# Patient Record
Sex: Female | Born: 1980 | Race: Black or African American | Hispanic: No | Marital: Single | Smoking: Never smoker
Health system: Southern US, Community
[De-identification: ages and names within clinical notes are randomized; demographics above are authoritative.]

## PROBLEM LIST (undated history)

## (undated) ENCOUNTER — Inpatient Hospital Stay (HOSPITAL_COMMUNITY): Payer: Self-pay

## (undated) DIAGNOSIS — R51 Headache: Secondary | ICD-10-CM

## (undated) DIAGNOSIS — D649 Anemia, unspecified: Secondary | ICD-10-CM

## (undated) DIAGNOSIS — R519 Headache, unspecified: Secondary | ICD-10-CM

## (undated) HISTORY — PX: WISDOM TOOTH EXTRACTION: SHX21

## (undated) HISTORY — PX: DILATION AND CURETTAGE OF UTERUS: SHX78

## (undated) HISTORY — PX: HERNIA REPAIR: SHX51

---

## 2014-09-10 LAB — OB RESULTS CONSOLE GC/CHLAMYDIA
CHLAMYDIA, DNA PROBE: NEGATIVE
GC PROBE AMP, GENITAL: NEGATIVE

## 2014-09-10 LAB — OB RESULTS CONSOLE ABO/RH: RH Type: POSITIVE

## 2014-09-10 LAB — OB RESULTS CONSOLE ANTIBODY SCREEN: Antibody Screen: NEGATIVE

## 2014-09-10 LAB — OB RESULTS CONSOLE RPR: RPR: NONREACTIVE

## 2014-09-10 LAB — OB RESULTS CONSOLE HEPATITIS B SURFACE ANTIGEN: Hepatitis B Surface Ag: NEGATIVE

## 2014-09-10 LAB — OB RESULTS CONSOLE HIV ANTIBODY (ROUTINE TESTING): HIV: NONREACTIVE

## 2014-10-07 ENCOUNTER — Other Ambulatory Visit (HOSPITAL_COMMUNITY): Payer: Self-pay | Admitting: Obstetrics and Gynecology

## 2014-10-07 DIAGNOSIS — O4402 Placenta previa specified as without hemorrhage, second trimester: Secondary | ICD-10-CM

## 2014-10-07 DIAGNOSIS — Z3689 Encounter for other specified antenatal screening: Secondary | ICD-10-CM

## 2014-10-13 ENCOUNTER — Ambulatory Visit (HOSPITAL_COMMUNITY)
Admission: RE | Admit: 2014-10-13 | Discharge: 2014-10-13 | Disposition: A | Discharge status: Home / Self Care | Payer: Medicaid Other | Source: Ambulatory Visit | Attending: Obstetrics and Gynecology | Admitting: Obstetrics and Gynecology

## 2014-10-13 ENCOUNTER — Encounter (HOSPITAL_COMMUNITY): Payer: Self-pay

## 2014-10-13 ENCOUNTER — Other Ambulatory Visit (HOSPITAL_COMMUNITY): Payer: Self-pay | Admitting: Obstetrics and Gynecology

## 2014-10-13 DIAGNOSIS — O4402 Placenta previa specified as without hemorrhage, second trimester: Secondary | ICD-10-CM

## 2014-10-13 DIAGNOSIS — O43192 Other malformation of placenta, second trimester: Secondary | ICD-10-CM | POA: Diagnosis not present

## 2014-10-13 DIAGNOSIS — O43212 Placenta accreta, second trimester: Secondary | ICD-10-CM

## 2014-10-13 DIAGNOSIS — Z609 Problem related to social environment, unspecified: Secondary | ICD-10-CM | POA: Insufficient documentation

## 2014-10-13 DIAGNOSIS — N856 Intrauterine synechiae: Secondary | ICD-10-CM

## 2014-10-13 DIAGNOSIS — O4440 Low lying placenta NOS or without hemorrhage, unspecified trimester: Secondary | ICD-10-CM | POA: Insufficient documentation

## 2014-10-13 DIAGNOSIS — Z3A23 23 weeks gestation of pregnancy: Secondary | ICD-10-CM | POA: Insufficient documentation

## 2014-10-13 DIAGNOSIS — Z3689 Encounter for other specified antenatal screening: Secondary | ICD-10-CM

## 2014-10-13 DIAGNOSIS — Z659 Problem related to unspecified psychosocial circumstances: Secondary | ICD-10-CM | POA: Insufficient documentation

## 2014-10-13 HISTORY — DX: Anemia, unspecified: D64.9

## 2014-10-13 HISTORY — DX: Headache, unspecified: R51.9

## 2014-10-13 HISTORY — DX: Headache: R51

## 2014-10-13 MED ORDER — BETAMETHASONE SOD PHOS & ACET 6 (3-3) MG/ML IJ SUSP: 12.0000 mg | Freq: Every day | INTRAMUSCULAR | Status: DC

## 2014-10-13 NOTE — Progress Notes (Signed)
MATERNAL FETAL MEDICINE CONSULT  Patient Name: Carla Rodgers Medical Record Number:  [id number] Date of Birth: 03/13/1981 Requesting Physician Name:  Jaymes GraffNaima Dillard, MD Date of Service: 10/13/2014  Chief Complaint Abnormal placentation  History of Present Illness Carla Rodgers was seen today for prenatal diagnosis secondary to abnormal placentation at the request of Dr. Joya Gaskinsilliard.  The patient is a 33 y.o. [id number],LOG4P1021,at 6395w5d with an EDD of 02/04/2015, by Ultrasound dating method. Patient complains of recent upper respiratory infection but otherwise has no complaints. She denies vaginal bleeding, contractions, and loss of fluid. Please see assessment and plan for additional pertinent details regarding patient problems.  Patient History OB History  Gravida Para Term Preterm AB SAB TAB Ectopic Multiple Living  4 1 1  2 1 1   1     # Outcome Date GA Lbr Len/2nd Weight Sex Delivery Anes PTL Lv  4 Current           3 TAB           2 SAB           1 Term               Past Medical History  Diagnosis Date  . Anemia   . Headache     Past Surgical History  Procedure Laterality Date  . Hernia repair    . Cesarean section    . Dilation and curettage of uterus    . Wisdom tooth extraction      History   Social History  . Marital Status: Single    Spouse Name: N/A    Number of Children: N/A  . Years of Education: N/A   Social History Main Topics  . Smoking status: Never Smoker   . Smokeless tobacco: Not on file  . Alcohol Use: No  . Drug Use: No  . Sexual Activity: Not on file   Other Topics Concern  . Not on file   Social History Narrative  . No narrative on file   In addition, the patient has no family history of mental retardation, birth defects, or genetic diseases. She specifically denies any history of fragile X, phenylketonuria, sickle cell disease and other hemoglobinopathies, clotting disorders, and thrombophilias.  33 year old G4P1021 at 23+5 weeks' gestation  with low-lying placenta, possible accreta, succenturiate placenta, and uterine synechiae with bridging vessels  Assessment and Recommendations  1. Low-lying placenta with suspicion of placenta accreta, uterine synechae with bridging vessels - Right-sided synechiae in lower uterine segment with vessels coursing along its length - Probable succinturiate placenta - Placenta does not cover internal cervical os but lies within 1.2 cm - I spoke with Dr. Redmond Basemanillard's partner, Dr. Estanislado Pandyivard, regarding the patient's diagnosis and plan of care  Counseled: - No intercourse - Increased risk of pre-term delivery, hemorrhage, fetal growth restriction - Increased risk of cesarean hysterectomy, rendering patient infertile, especially if placenta adherent - Report to nearest hospital if labor, vaginal bleeding, or rupture of membranes  Plan - Outpatient course of betamethasone; [place] so will administer next week - Weekly cervical lengths; [facility] and thereafter alternate weeks with Dr. Redmond Basemanillard's practice - Inpatient management advised if cervix < 2.5 cm  - Fetal growth assessment every 3-4 weeks - Reassess placenta during growth studies - If suspicion for accreta persists, [facility]; also plan for consultation with gynecologic oncology and anesthesiology and obtain MRI  2.  Challenging social situation - [place] (per her report) - She does not own a car and must take public transportation to [place]  Counseled: - Notify bus driver immediately if bleeding, labor, or rupture of membranes, and have bus driver call ambulance - Notify attorney of possible hospitalization if patient develops cervical shortening  Plan - If patient or her attorney have questions, advised to call our clinic for guidance   I spent 30 minutes with Ms. Rackley today  of which 50% was face-to-face counseling. I discussed patient with Dr. Claudean SeveranceWhitecar.  Thank you [facility].  Please do not hesitate to contact us with questions.   Fredrik RiggerGARDNER, Tamarra Geiselman R, MD

## 2014-10-13 NOTE — Discharge Instructions (Signed)
Before Baby Comes Home °Ask any questions about feeding, diapering, and baby care before you leave the hospital. Ask again if you do not understand. Ask when you need to see the doctor again. °There are several things you must have before your baby comes home. °· Infant car seat. °· Crib. °¨ Do not let your baby sleep in a bed with you or anyone else. °¨ If you do not have a bed for your baby, ask the doctor what you can use that will be safe for the baby to sleep in. °Infant feeding supplies: °· 6 to 8 bottles (8 ounce size). °· 6 to 8 nipples. °· Measuring cup. °· Measuring tablespoon. °· Bottle brush. °· Sterilizer (or use any large pan or kettle with a lid). °· Formula that contains iron. °· A way to boil and cool water. °Breastfeeding supplies: °· Breast pump. °· Nipple cream. °Clothing: °· 24 to 36 cloth diapers and waterproof diaper covers or a box of disposable diapers. You may need as many as 10 to 12 diapers per day. °· 3 onesies (other clothing will depend on the time of year and the weather). °· 3 receiving blankets. °· 3 baby pajamas or gowns. °· 3 bibs. °Bath equipment: °· Mild soap. °· Petroleum jelly. No baby oil or powder. °· Soft cloth towel and washcloth. °· Cotton balls. °· Separate bath basin for baby. Only sponge bathe until umbilical cord and circumcision are healed. °Other supplies: °· Thermometer and bulb syringe (ask the hospital to send them home with you). Ask your doctor about how you should take your baby's temperature. °· One to two pacifiers. °Prepare for an emergency: °· Know how to get to the hospital and know where to admit your baby. °· Put all doctor numbers near your house phone and in your cell phone if you have one. °Prepare your family: °· Talk with siblings about the baby coming home and how they feel about it. °· Decide how you want to handle visitors and other family members. °· Take offers for help with the baby. You will need time to adjust. °Know when to call the  doctor.  °GET HELP RIGHT AWAY IF: °· Your baby's temperature is greater than 100.4°F (38°C). °· The soft spot on your baby's head starts to bulge. °· Your baby is crying with no tears or has no wet diapers for 6 hours. °· Your baby has rapid breathing. °· Your baby is not as alert. °Document Released: 11/09/2008 Document Revised: 04/13/2014 Document Reviewed: 02/16/2011 °ExitCare® Patient Information ©2015 ExitCare, LLC. This information is not intended to replace advice given to you by your health care provider. Make sure you discuss any questions you have with your health care provider. ° °

## 2014-10-13 NOTE — Addendum Note (Signed)
Encounter addended by: Providence Stivers SwazilandJordan Iliany Losier, RPH on: 10/13/2014 11:40 AM<BR>     Documentation filed: Rx Order Verification, Orders

## 2014-10-13 NOTE — Addendum Note (Signed)
Encounter addended by: Fredrik RiggerBennett R Gemini Bunte, MD on: 10/13/2014 12:34 PM<BR>     Documentation filed: Charges VN

## 2014-10-16 ENCOUNTER — Inpatient Hospital Stay (HOSPITAL_COMMUNITY)
Admission: AD | Admit: 2014-10-16 | Discharge: 2014-10-17 | Disposition: A | Discharge status: Home / Self Care | Payer: Medicaid Other | Source: Ambulatory Visit | Attending: Obstetrics and Gynecology | Admitting: Obstetrics and Gynecology

## 2014-10-16 ENCOUNTER — Encounter (HOSPITAL_COMMUNITY): Payer: Self-pay | Admitting: *Deleted

## 2014-10-16 DIAGNOSIS — R109 Unspecified abdominal pain: Secondary | ICD-10-CM | POA: Diagnosis not present

## 2014-10-16 DIAGNOSIS — R350 Frequency of micturition: Secondary | ICD-10-CM | POA: Insufficient documentation

## 2014-10-16 DIAGNOSIS — Z3A24 24 weeks gestation of pregnancy: Secondary | ICD-10-CM | POA: Diagnosis not present

## 2014-10-16 DIAGNOSIS — K5900 Constipation, unspecified: Secondary | ICD-10-CM | POA: Diagnosis not present

## 2014-10-16 DIAGNOSIS — N898 Other specified noninflammatory disorders of vagina: Secondary | ICD-10-CM | POA: Insufficient documentation

## 2014-10-16 DIAGNOSIS — N760 Acute vaginitis: Secondary | ICD-10-CM

## 2014-10-16 DIAGNOSIS — B9689 Other specified bacterial agents as the cause of diseases classified elsewhere: Secondary | ICD-10-CM

## 2014-10-16 DIAGNOSIS — O4412 Placenta previa with hemorrhage, second trimester: Secondary | ICD-10-CM | POA: Insufficient documentation

## 2014-10-16 NOTE — MAU Note (Signed)
Since Weds I have had a lot of pain between my legs. Peeing constantly. No pain when urinates

## 2014-10-17 LAB — URINALYSIS, ROUTINE W REFLEX MICROSCOPIC
BILIRUBIN URINE: NEGATIVE
GLUCOSE, UA: NEGATIVE mg/dL
Hgb urine dipstick: NEGATIVE
Ketones, ur: NEGATIVE mg/dL
Leukocytes, UA: NEGATIVE
Nitrite: NEGATIVE
Protein, ur: NEGATIVE mg/dL
Specific Gravity, Urine: 1.015 (ref 1.005–1.030)
Urobilinogen, UA: 1 mg/dL (ref 0.0–1.0)
pH: 7 (ref 5.0–8.0)

## 2014-10-17 LAB — FETAL FIBRONECTIN: FETAL FIBRONECTIN: NEGATIVE

## 2014-10-17 LAB — WET PREP, GENITAL
TRICH WET PREP: NONE SEEN
YEAST WET PREP: NONE SEEN

## 2014-10-17 MED ORDER — METRONIDAZOLE 500 MG PO TABS: 500.0000 mg | ORAL_TABLET | Freq: Two times a day (BID) | ORAL | Status: DC

## 2014-10-17 NOTE — MAU Provider Note (Signed)
History    Carla CavesLatoya Carla Rodgers is a 33 y.o. [id number] at 24.2wks who presents for vaginal pressure and abdominal cramping.  Patient states symptoms have been present since Wednesday and she has been managing them with tylenol and rest.  Patient reports increased urination, vaginal discharge, constipation, and recent illness, but unsure of fever.  Patient denies VB, LoF, contractions, and reports active fetus.  Patient denies recent sexual intercourse and reports "some placenta issues."  Patient Active Problem List   Diagnosis Date Noted  . Low lying placenta without hemorrhage, antepartum 10/13/2014  . 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 Social problem 10/13/2014  . [redacted] weeks gestation of pregnancy 10/13/2014    Chief Complaint  Patient presents with  . Urinary Frequency   HPI  OB History    Gravida Para Term Preterm AB TAB SAB Ectopic Multiple Living   4 1 1  2 1 1   1       Past Medical History  Diagnosis Date  . Anemia   . Headache     Past Surgical History  Procedure Laterality Date  . Hernia repair    . Cesarean section    . Dilation and curettage of uterus    . Wisdom tooth extraction      History reviewed. No pertinent family history.  History  Substance Use Topics  . Smoking status: Never Smoker   . Smokeless tobacco: Never Used  . Alcohol Use: No    Allergies: No Known Allergies  Prescriptions prior to admission  Medication Sig Dispense Refill Last Dose  . acetaminophen (TYLENOL) 325 MG tablet Take 650 mg by mouth every 6 (six) hours as needed.   10/16/2014 at Unknown time  . Prenatal Vit-Fe Fumarate-FA (PRENATAL VITAMIN PO) Take by mouth.   10/15/2014 at Unknown time    ROS  See HPI Above Physical Exam   Blood pressure 109/69, pulse 91, temperature 98 F (36.7 C), resp. rate 18, height 5\' 8"  (1.727 m), weight 168 lb 9.6 oz (76.476 kg), last menstrual  period 04/11/2014. Results for orders placed or performed during the hospital encounter of 10/16/14 (from the past 24 hour(s))  Urinalysis, Routine w reflex microscopic     Status: None   Collection Time: 10/16/14 11:12 PM  Result Value Ref Range   Color, Urine YELLOW YELLOW   APPearance CLEAR CLEAR   Specific Gravity, Urine 1.015 1.005 - 1.030   pH 7.0 5.0 - 8.0   Glucose, UA NEGATIVE NEGATIVE mg/dL   Hgb urine dipstick NEGATIVE NEGATIVE   Bilirubin Urine NEGATIVE NEGATIVE   Ketones, ur NEGATIVE NEGATIVE mg/dL   Protein, ur NEGATIVE NEGATIVE mg/dL   Urobilinogen, UA 1.0 0.0 - 1.0 mg/dL   Nitrite NEGATIVE NEGATIVE   Leukocytes, UA NEGATIVE NEGATIVE  Wet prep, genital     Status: Abnormal   Collection Time: 10/16/14 11:58 PM  Result Value Ref Range   Yeast Wet Prep HPF POC NONE SEEN NONE SEEN   Trich, Wet Prep NONE SEEN NONE SEEN   Clue Cells Wet Prep HPF POC FEW (A) NONE SEEN   WBC, Wet Prep HPF POC FEW (A) NONE SEEN  Fetal fibronectin     Status: None   Collection Time: 10/16/14 11:58 PM  Result Value Ref Range   Fetal Fibronectin NEGATIVE NEGATIVE   Physical Exam  Constitutional: She is oriented to person, place, and time. She appears well-developed and well-nourished.  Cardiovascular:  Normal rate, regular rhythm and normal heart sounds.   Respiratory: Effort normal and breath sounds normal.  GI: Soft. Bowel sounds are normal. There is no tenderness. There is no CVA tenderness.  Genitourinary: Cervix exhibits no motion tenderness. No bleeding in the vagina. Vaginal discharge found.  Sterile Speculum Exam: -Vaginal Vault: Thin white discharge noted-wet prep collected -Cervix: Appears closed Bimanual Exam: Closed/50/Ballottable--Medium Consistency   Neurological: She is alert and oriented to person, place, and time.  Skin: Skin is warm and dry.   FHR: 155 bpm, Mod Var, -Decels, +Accels UC: None graphed or palpated ED Course  Assessment: IUP at 24.2wks Cat I FT Vaginal  Pressure Abdominal Cramping Low Lying Placenta Questionable Placenta Accreta  Plan: -Labs: UA, Gc/Ct, fFN, Wet Prep -Reactive NST, may discontinue EFM  Follow Up (0102) -Wet Prep: +Clue Cells--Bacterial Vaginosis -fFN-Negative -UA-Negative -Gc/CT-Pending -Discussed results and rx sent  -Bleeding Precautions -Keep appt as scheduled: MFM Appt on 10/20/2014 -Encouraged to call if any questions or concerns arise prior to next scheduled office visit.  -Discussed relief measures for vaginal pressure until RX takes effect -Discharged to home in stable condition  Steffan Caniglia LYNN CNM, MSN 10/17/2014 12:11 AM

## 2014-10-17 NOTE — Discharge Instructions (Signed)
Bacterial Vaginosis Bacterial vaginosis is a vaginal infection that occurs when the normal balance of bacteria in the vagina is disrupted. It results from an overgrowth of certain bacteria. This is the most common vaginal infection in women of childbearing age. Treatment is important to prevent complications, especially in pregnant women, as it can cause a premature delivery. CAUSES  Bacterial vaginosis is caused by an increase in harmful bacteria that are normally present in smaller amounts in the vagina. Several different kinds of bacteria can cause bacterial vaginosis. However, the reason that the condition develops is not fully understood. RISK FACTORS Certain activities or behaviors can put you at an increased risk of developing bacterial vaginosis, including: 1. Having a new sex partner or multiple sex partners. 2. Douching. 3. Using an intrauterine device (IUD) for contraception. Women do not get bacterial vaginosis from toilet seats, bedding, swimming pools, or contact with objects around them. SIGNS AND SYMPTOMS  Some women with bacterial vaginosis have no signs or symptoms. Common symptoms include:  Grey vaginal discharge.  A fishlike odor with discharge, especially after sexual intercourse.  Itching or burning of the vagina and vulva.  Burning or pain with urination. DIAGNOSIS  Your health care provider will take a medical history and examine the vagina for signs of bacterial vaginosis. A sample of vaginal fluid may be taken. Your health care provider will look at this sample under a microscope to check for bacteria and abnormal cells. A vaginal pH test may also be done.  TREATMENT  Bacterial vaginosis may be treated with antibiotic medicines. These may be given in the form of a pill or a vaginal cream. A second round of antibiotics may be prescribed if the condition comes back after treatment.  HOME CARE INSTRUCTIONS   Only take over-the-counter or prescription medicines as  directed by your health care provider.  If antibiotic medicine was prescribed, take it as directed. Make sure you finish it even if you start to feel better.  Do not have sex until treatment is completed.  Tell all sexual partners that you have a vaginal infection. They should see their health care provider and be treated if they have problems, such as a mild rash or itching.  Practice safe sex by using condoms and only having one sex partner. SEEK MEDICAL CARE IF:   Your symptoms are not improving after 3 days of treatment.  You have increased discharge or pain.  You have a fever. MAKE SURE YOU:   Understand these instructions.  Will watch your condition.  Will get help right away if you are not doing well or get worse. FOR MORE INFORMATION  Centers for Disease Control and Prevention, Division of STD Prevention: SolutionApps.co.zawww.cdc.gov/std American Sexual Health Association (ASHA): www.ashastd.org  Document Released: 11/27/2005 Document Revised: 09/17/2013 Document Reviewed: 07/09/2013 [facility] Patient Information 2015 [place], [place]. This information is not intended to replace advice given to you by your health care provider. Make sure you discuss any questions you have with your health care provider. Placenta Previa  Placenta previa is a condition in pregnant women where the placenta implants in the lower part of the uterus. The placenta either partially or completely covers the opening to the cervix. This is a problem because the baby must pass through the cervix during delivery. There are three types of placenta previa. They include:  4. Marginal placenta previa. The placenta is near the cervix, but does not cover the opening. 5. Partial placenta previa. The placenta covers part of the cervical  opening. 6. Complete placenta previa. The placenta covers the entire cervical opening.  Depending on the type of placenta previa, there is a chance the placenta may move into a normal position and  no longer cover the cervix as the pregnancy progresses. It is important to keep all prenatal visits with your caregiver.  RISK FACTORS You may be more likely to develop placenta previa if you:   Are carrying more than one baby (multiples).   Have an abnormally shaped uterus.   Have scars on the lining of the uterus.   Had previous surgeries involving the uterus, such as a cesarean delivery.   Have delivered a baby previously.   Have a history of placenta previa.   Have smoked or used cocaine during pregnancy.   Are age 31 or older during pregnancy.  SYMPTOMS The main symptom of placenta previa is sudden, painless vaginal bleeding during the second half of pregnancy. The amount of bleeding can be light to very heavy. The bleeding may stop on its own, but almost always returns. Cramping, regular contractions, abdominal pain, and lower back pain can also occur with placenta previa.  DIAGNOSIS Placenta previa can be diagnosed through an ultrasound by finding where the placenta is located. The ultrasound may find placenta previa either during a routine prenatal visit or after vaginal bleeding is noticed. If you are diagnosed with placenta previa, your caregiver may avoid vaginal exams to reduce the risk of heavy bleeding. There is a chance that placenta previa may not be diagnosed until bleeding occurs during labor.  TREATMENT Specific treatment depends on:   How much you are bleeding or if the bleeding has stopped.  How far along you are in your pregnancy.   The condition of the baby.   The location of the baby and placenta.   The type of placenta previa.  Depending on the factors above, your caregiver may recommend:   Decreased activity.   Bed rest at home or in the hospital.  Pelvic rest. This means no sex, using tampons, douching, pelvic exams, or placing anything into the vagina.  A blood transfusion to replace maternal blood loss.  A cesarean delivery if the  bleeding is heavy and cannot be controlled or the placenta completely covers the cervix.  Medication to stop premature labor or mature the fetal lungs if delivery is needed before the pregnancy is full term.  WHEN SHOULD YOU SEEK IMMEDIATE MEDICAL CARE IF YOU ARE SENT HOME WITH PLACENTA PREVIA? Seek immediate medical care if you show any symptoms of placenta previa. You will need to go to the hospital to get checked immediately. Again, those symptoms are:  Sudden, painless vaginal bleeding, even a small amount.  Cramping or regular contractions.  Lower back or abdominal pain. Document Released: 11/27/2005 Document Revised: 07/30/2013 Document Reviewed: 02/28/2013 [facility] Patient Information 2015 [place], [place]. This information is not intended to replace advice given to you by your health care provider. Make sure you discuss any questions you have with your health care provider.

## 2014-10-19 LAB — GC/CHLAMYDIA PROBE AMP
CT Probe RNA: NEGATIVE
GC Probe RNA: NEGATIVE

## 2014-10-20 ENCOUNTER — Other Ambulatory Visit (HOSPITAL_COMMUNITY): Payer: Self-pay | Admitting: Obstetrics and Gynecology

## 2014-10-20 ENCOUNTER — Ambulatory Visit (HOSPITAL_COMMUNITY)
Admission: RE | Admit: 2014-10-20 | Discharge: 2014-10-20 | Disposition: A | Discharge status: Home / Self Care | Payer: Medicaid Other | Source: Ambulatory Visit | Attending: Obstetrics and Gynecology | Admitting: Obstetrics and Gynecology

## 2014-10-20 ENCOUNTER — Other Ambulatory Visit (HOSPITAL_COMMUNITY): Payer: Self-pay

## 2014-10-20 ENCOUNTER — Encounter (HOSPITAL_COMMUNITY): Payer: Self-pay

## 2014-10-20 VITALS — BP 115/69 | HR 89 | Wt 170.0 lb

## 2014-10-20 DIAGNOSIS — N856 Intrauterine synechiae: Secondary | ICD-10-CM

## 2014-10-20 DIAGNOSIS — O43212 Placenta accreta, second trimester: Secondary | ICD-10-CM

## 2014-10-20 DIAGNOSIS — Z3A23 23 weeks gestation of pregnancy: Secondary | ICD-10-CM

## 2014-10-20 DIAGNOSIS — O4402 Placenta previa specified as without hemorrhage, second trimester: Secondary | ICD-10-CM

## 2014-10-20 DIAGNOSIS — Z3A24 24 weeks gestation of pregnancy: Secondary | ICD-10-CM | POA: Diagnosis not present

## 2014-10-20 DIAGNOSIS — O3421 Maternal care for scar from previous cesarean delivery: Secondary | ICD-10-CM | POA: Diagnosis not present

## 2014-10-20 MED ORDER — BETAMETHASONE SOD PHOS & ACET 6 (3-3) MG/ML IJ SUSP
12.0000 mg | Freq: Once | INTRAMUSCULAR | Status: AC
  Administered 2014-10-20: 12 mg via INTRAMUSCULAR
  Filled 2014-10-20: qty 2

## 2014-10-21 ENCOUNTER — Ambulatory Visit (HOSPITAL_COMMUNITY)
Admission: RE | Admit: 2014-10-21 | Discharge: 2014-10-21 | Disposition: A | Discharge status: Home / Self Care | Payer: Medicaid Other | Source: Ambulatory Visit | Attending: Obstetrics and Gynecology | Admitting: Obstetrics and Gynecology

## 2014-10-21 DIAGNOSIS — O4402 Placenta previa specified as without hemorrhage, second trimester: Secondary | ICD-10-CM | POA: Diagnosis not present

## 2014-10-21 DIAGNOSIS — N856 Intrauterine synechiae: Secondary | ICD-10-CM | POA: Diagnosis not present

## 2014-10-21 DIAGNOSIS — O43212 Placenta accreta, second trimester: Secondary | ICD-10-CM | POA: Diagnosis not present

## 2014-10-21 DIAGNOSIS — Z3A23 23 weeks gestation of pregnancy: Secondary | ICD-10-CM | POA: Insufficient documentation

## 2014-10-21 MED ORDER — BETAMETHASONE SOD PHOS & ACET 6 (3-3) MG/ML IJ SUSP
12.0000 mg | Freq: Once | INTRAMUSCULAR | Status: AC
  Administered 2014-10-21: 12 mg via INTRAMUSCULAR
  Filled 2014-10-21: qty 2

## 2014-10-30 ENCOUNTER — Other Ambulatory Visit: Payer: Self-pay | Admitting: Obstetrics & Gynecology

## 2014-10-30 DIAGNOSIS — O43212 Placenta accreta, second trimester: Secondary | ICD-10-CM

## 2014-11-10 ENCOUNTER — Other Ambulatory Visit (HOSPITAL_COMMUNITY): Payer: Self-pay | Admitting: Maternal and Fetal Medicine

## 2014-11-10 ENCOUNTER — Encounter (HOSPITAL_COMMUNITY): Payer: Self-pay

## 2014-11-10 ENCOUNTER — Ambulatory Visit (HOSPITAL_COMMUNITY)
Admission: RE | Admit: 2014-11-10 | Discharge: 2014-11-10 | Disposition: A | Discharge status: Home / Self Care | Payer: Medicaid Other | Source: Ambulatory Visit | Attending: Obstetrics and Gynecology | Admitting: Obstetrics and Gynecology

## 2014-11-10 DIAGNOSIS — O4402 Placenta previa specified as without hemorrhage, second trimester: Secondary | ICD-10-CM | POA: Diagnosis not present

## 2014-11-10 DIAGNOSIS — O3421 Maternal care for scar from previous cesarean delivery: Secondary | ICD-10-CM | POA: Diagnosis not present

## 2014-11-10 DIAGNOSIS — Z3A27 27 weeks gestation of pregnancy: Secondary | ICD-10-CM | POA: Diagnosis not present

## 2014-11-13 ENCOUNTER — Inpatient Hospital Stay: Admission: RE | Admit: 2014-11-13 | Payer: Medicaid Other | Source: Ambulatory Visit

## 2014-11-20 ENCOUNTER — Ambulatory Visit
Admission: RE | Admit: 2014-11-20 | Discharge: 2014-11-20 | Disposition: A | Discharge status: Home / Self Care | Payer: Medicaid Other | Source: Ambulatory Visit | Attending: Obstetrics & Gynecology | Admitting: Obstetrics & Gynecology

## 2014-11-20 DIAGNOSIS — O43212 Placenta accreta, second trimester: Secondary | ICD-10-CM

## 2014-11-23 ENCOUNTER — Inpatient Hospital Stay (HOSPITAL_COMMUNITY)
Admission: AD | Admit: 2014-11-23 | Discharge: 2014-11-23 | Disposition: A | Discharge status: Home / Self Care | Payer: Medicaid Other | Source: Ambulatory Visit | Attending: Obstetrics and Gynecology | Admitting: Obstetrics and Gynecology

## 2014-11-23 ENCOUNTER — Encounter (HOSPITAL_COMMUNITY): Payer: Self-pay | Admitting: *Deleted

## 2014-11-23 DIAGNOSIS — Z3A29 29 weeks gestation of pregnancy: Secondary | ICD-10-CM | POA: Diagnosis not present

## 2014-11-23 DIAGNOSIS — O212 Late vomiting of pregnancy: Secondary | ICD-10-CM | POA: Diagnosis present

## 2014-11-23 DIAGNOSIS — O9989 Other specified diseases and conditions complicating pregnancy, childbirth and the puerperium: Secondary | ICD-10-CM | POA: Insufficient documentation

## 2014-11-23 DIAGNOSIS — O4403 Placenta previa specified as without hemorrhage, third trimester: Secondary | ICD-10-CM | POA: Diagnosis not present

## 2014-11-23 DIAGNOSIS — O4400 Placenta previa specified as without hemorrhage, unspecified trimester: Secondary | ICD-10-CM

## 2014-11-23 DIAGNOSIS — Z3A23 23 weeks gestation of pregnancy: Secondary | ICD-10-CM

## 2014-11-23 DIAGNOSIS — N856 Intrauterine synechiae: Secondary | ICD-10-CM

## 2014-11-23 DIAGNOSIS — B349 Viral infection, unspecified: Secondary | ICD-10-CM | POA: Diagnosis not present

## 2014-11-23 DIAGNOSIS — O43212 Placenta accreta, second trimester: Secondary | ICD-10-CM

## 2014-11-23 DIAGNOSIS — O4402 Placenta previa specified as without hemorrhage, second trimester: Secondary | ICD-10-CM

## 2014-11-23 LAB — CBC WITH DIFFERENTIAL/PLATELET
BASOS ABS: 0 10*3/uL (ref 0.0–0.1)
BASOS PCT: 0 % (ref 0–1)
EOS ABS: 0 10*3/uL (ref 0.0–0.7)
EOS PCT: 1 % (ref 0–5)
HCT: 34.1 % — ABNORMAL LOW (ref 36.0–46.0)
Hemoglobin: 12 g/dL (ref 12.0–15.0)
Lymphocytes Relative: 24 % (ref 12–46)
Lymphs Abs: 1.5 10*3/uL (ref 0.7–4.0)
MCH: 32.3 pg (ref 26.0–34.0)
MCHC: 35.2 g/dL (ref 30.0–36.0)
MCV: 91.9 fL (ref 78.0–100.0)
MONO ABS: 0.5 10*3/uL (ref 0.1–1.0)
Monocytes Relative: 8 % (ref 3–12)
Neutro Abs: 4.4 10*3/uL (ref 1.7–7.7)
Neutrophils Relative %: 67 % (ref 43–77)
Platelets: 202 10*3/uL (ref 150–400)
RBC: 3.71 MIL/uL — ABNORMAL LOW (ref 3.87–5.11)
RDW: 12.3 % (ref 11.5–15.5)
WBC: 6.4 10*3/uL (ref 4.0–10.5)

## 2014-11-23 LAB — URINALYSIS, ROUTINE W REFLEX MICROSCOPIC
Bilirubin Urine: NEGATIVE
GLUCOSE, UA: NEGATIVE mg/dL
HGB URINE DIPSTICK: NEGATIVE
Ketones, ur: NEGATIVE mg/dL
Nitrite: NEGATIVE
PROTEIN: NEGATIVE mg/dL
Specific Gravity, Urine: 1.025 (ref 1.005–1.030)
Urobilinogen, UA: 0.2 mg/dL (ref 0.0–1.0)
pH: 6.5 (ref 5.0–8.0)

## 2014-11-23 LAB — URINE MICROSCOPIC-ADD ON

## 2014-11-23 LAB — COMPREHENSIVE METABOLIC PANEL
ALBUMIN: 3 g/dL — AB (ref 3.5–5.2)
ALT: 8 U/L (ref 0–35)
AST: 12 U/L (ref 0–37)
Alkaline Phosphatase: 60 U/L (ref 39–117)
Anion gap: 12 (ref 5–15)
BUN: 7 mg/dL (ref 6–23)
CALCIUM: 9.2 mg/dL (ref 8.4–10.5)
CO2: 24 mEq/L (ref 19–32)
CREATININE: 0.65 mg/dL (ref 0.50–1.10)
Chloride: 100 mEq/L (ref 96–112)
GFR calc Af Amer: >90 mL/min (ref >90)
Glucose, Bld: 91 mg/dL (ref 70–99)
Potassium: 4.1 mEq/L (ref 3.7–5.3)
Sodium: 136 mEq/L — ABNORMAL LOW (ref 137–147)
Total Bilirubin: 0.2 mg/dL — ABNORMAL LOW (ref 0.3–1.2)
Total Protein: 7 g/dL (ref 6.0–8.3)

## 2014-11-23 MED ORDER — ONDANSETRON 4 MG PO TBDP
4.0000 mg | ORAL_TABLET | Freq: Once | ORAL | Status: AC
  Administered 2014-11-23: 4 mg via ORAL
  Filled 2014-11-23: qty 1

## 2014-11-23 MED ORDER — ONDANSETRON HCL 4 MG PO TABS: 4.0000 mg | ORAL_TABLET | Freq: Three times a day (TID) | ORAL | Status: DC | PRN

## 2014-11-23 NOTE — Discharge Instructions (Signed)

## 2014-11-23 NOTE — MAU Note (Signed)
C/o flu symptoms starting this AM;

## 2014-11-23 NOTE — MAU Provider Note (Signed)
History   33 yo G4P1021 at 6129 4/7 weeks presented unannounced c/o N/V/diarrhea earlier today.  Some mild cramping in upper abdomen.  Does report pelvic pressure, "but have had that for a long time".  Denies fever, bleeding, leaking,dysuria, known viral exposure, or any other issue.  Reports +FM.  Reports only isolated event of vomiting and diarrhea earlier today, with none recently.  Has been able to drink fluids today without issue.  Hx remarkable for recent MRI to r/o accreta--MRI negative for accreta.  LL placenta seen on MRI, marginal previa noted on recent US at M[facility].  Succenturiate lobe also noted.    Patient Active Problem List   Diagnosis Date Noted  . Viral syndrome 11/23/2014  . Marginal placenta previa   . Low lying placenta without hemorrhage, antepartum--no evidence of accreta on MRI 11/20/14 10/13/2014  . 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 Social problem 10/13/2014    Chief Complaint  Patient presents with  . Abdominal Cramping  . Nausea  . Emesis  . Diarrhea  . Headache   HPI:  See above  OB History    Gravida Para Term Preterm AB TAB SAB Ectopic Multiple Living   4 1 1  2 1 1   1       Past Medical History  Diagnosis Date  . Anemia   . Headache     Past Surgical History  Procedure Laterality Date  . Hernia repair    . Cesarean section    . Dilation and curettage of uterus    . Wisdom tooth extraction      History reviewed. No pertinent family history.  History  Substance Use Topics  . Smoking status: Never Smoker   . Smokeless tobacco: Never Used  . Alcohol Use: No    Allergies: No Known Allergies  Prescriptions prior to admission  Medication Sig Dispense Refill Last Dose  . Prenatal Vit-Fe Fumarate-FA (PRENATAL MULTIVITAMIN) TABS tablet Take 1 tablet by mouth daily at 12 noon.   11/22/2014 at Unknown time  . acetaminophen (TYLENOL) 325 MG tablet Take 650 mg by mouth every 6 (six) hours as  needed.   PRN  . metroNIDAZOLE (FLAGYL) 500 MG tablet Take 1 tablet (500 mg total) by mouth 2 (two) times daily. (Patient not taking: Reported on 11/23/2014) 14 tablet 0 Completed Course at Unknown time    ROS:  Mild nausea, vomiting and diarrhea earlier today, none recently. Physical Exam   Blood pressure 106/69, pulse 102, temperature 98.2 F (36.8 C), temperature source Oral, resp. rate 18, height 5\' 8"  (1.727 m), weight 177 lb (80.287 kg), last menstrual period 04/11/2014.  Physical Exam  In NAD Chest clear Heart RRR without murmur Abd gravid, NT Pelvic--deferred Ext WNL  FHR Category 1 UCs none  Results for orders placed or performed during the hospital encounter of 11/23/14 (from the past 24 hour(s))  Urinalysis, Routine w reflex microscopic     Status: Abnormal   Collection Time: 11/23/14  6:35 PM  Result Value Ref Range   Color, Urine YELLOW YELLOW   APPearance CLEAR CLEAR   Specific Gravity, Urine 1.025 1.005 - 1.030   pH 6.5 5.0 - 8.0   Glucose, UA NEGATIVE NEGATIVE mg/dL   Hgb urine dipstick NEGATIVE NEGATIVE   Bilirubin Urine NEGATIVE NEGATIVE   Ketones, ur NEGATIVE NEGATIVE mg/dL   Protein, ur NEGATIVE NEGATIVE mg/dL   Urobilinogen, UA 0.2 0.0 - 1.0 mg/dL   Nitrite NEGATIVE NEGATIVE  Leukocytes, UA SMALL (A) NEGATIVE  Urine microscopic-add on     Status: Abnormal   Collection Time: 11/23/14  6:35 PM  Result Value Ref Range   Squamous Epithelial / LPF MANY (A) RARE   WBC, UA 0-2 <3 WBC/hpf   Urine-Other MUCOUS PRESENT   CBC with Differential     Status: Abnormal   Collection Time: 11/23/14  6:39 PM  Result Value Ref Range   WBC 6.4 4.0 - 10.5 K/uL   RBC 3.71 (L) 3.87 - 5.11 MIL/uL   Hemoglobin 12.0 12.0 - 15.0 g/dL   HCT 19.134.1 (L) 47.836.0 - 29.546.0 %   MCV 91.9 78.0 - 100.0 fL   MCH 32.3 26.0 - 34.0 pg   MCHC 35.2 30.0 - 36.0 g/dL   RDW 62.112.3 30.811.5 - 65.715.5 %   Platelets 202 150 - 400 K/uL   Neutrophils Relative % 67 43 - 77 %   Neutro Abs 4.4 1.7 - 7.7 K/uL    Lymphocytes Relative 24 12 - 46 %   Lymphs Abs 1.5 0.7 - 4.0 K/uL   Monocytes Relative 8 3 - 12 %   Monocytes Absolute 0.5 0.1 - 1.0 K/uL   Eosinophils Relative 1 0 - 5 %   Eosinophils Absolute 0.0 0.0 - 0.7 K/uL   Basophils Relative 0 0 - 1 %   Basophils Absolute 0.0 0.0 - 0.1 K/uL  Comprehensive metabolic panel     Status: Abnormal   Collection Time: 11/23/14  6:39 PM  Result Value Ref Range   Sodium 136 (L) 137 - 147 mEq/L   Potassium 4.1 3.7 - 5.3 mEq/L   Chloride 100 96 - 112 mEq/L   CO2 24 19 - 32 mEq/L   Glucose, Bld 91 70 - 99 mg/dL   BUN 7 6 - 23 mg/dL   Creatinine, Ser 8.460.65 0.50 - 1.10 mg/dL   Calcium 9.2 8.4 - 96.210.5 mg/dL   Total Protein 7.0 6.0 - 8.3 g/dL   Albumin 3.0 (L) 3.5 - 5.2 g/dL   AST 12 0 - 37 U/L   ALT 8 0 - 35 U/L   Alkaline Phosphatase 60 39 - 117 U/L   Total Bilirubin 0.2 (L) 0.3 - 1.2 mg/dL   GFR calc non Af Amer >90 >90 mL/min   GFR calc Af Amer >90 >90 mL/min   Anion gap 12 5 - 15     ED Course  Assessment: IUP at 29 4/7 weeks Viral sx, resolving LLP vs marginal previa  Plan: D/C home Rx Zofran Keep scheduled appt at [facility] next Monday    Nigel BridgemanLATHAM, Tessa Seaberry CNM, MSN 11/23/2014 6:36 PM

## 2015-01-08 ENCOUNTER — Other Ambulatory Visit (HOSPITAL_COMMUNITY): Payer: Self-pay | Admitting: Obstetrics and Gynecology

## 2015-01-08 DIAGNOSIS — O4403 Placenta previa specified as without hemorrhage, third trimester: Secondary | ICD-10-CM

## 2015-01-11 ENCOUNTER — Encounter (HOSPITAL_COMMUNITY): Payer: Self-pay | Admitting: General Practice

## 2015-01-11 ENCOUNTER — Inpatient Hospital Stay (HOSPITAL_COMMUNITY)
Admission: AD | Admit: 2015-01-11 | Discharge: 2015-01-11 | Disposition: A | Discharge status: Home / Self Care | Payer: Medicaid Other | Source: Ambulatory Visit | Attending: Obstetrics & Gynecology | Admitting: Obstetrics & Gynecology

## 2015-01-11 DIAGNOSIS — R531 Weakness: Secondary | ICD-10-CM | POA: Diagnosis not present

## 2015-01-11 DIAGNOSIS — O365933 Maternal care for other known or suspected poor fetal growth, third trimester, fetus 3: Secondary | ICD-10-CM

## 2015-01-11 DIAGNOSIS — Z3A36 36 weeks gestation of pregnancy: Secondary | ICD-10-CM | POA: Diagnosis not present

## 2015-01-11 DIAGNOSIS — R42 Dizziness and giddiness: Secondary | ICD-10-CM

## 2015-01-11 DIAGNOSIS — O9989 Other specified diseases and conditions complicating pregnancy, childbirth and the puerperium: Secondary | ICD-10-CM

## 2015-01-11 LAB — COMPREHENSIVE METABOLIC PANEL
ALK PHOS: 90 U/L (ref 39–117)
ALT: 12 U/L (ref 0–35)
AST: 19 U/L (ref 0–37)
Albumin: 3 g/dL — ABNORMAL LOW (ref 3.5–5.2)
Anion gap: 8 (ref 5–15)
BUN: 13 mg/dL (ref 6–23)
CALCIUM: 9.3 mg/dL (ref 8.4–10.5)
CO2: 22 mmol/L (ref 19–32)
CREATININE: 0.65 mg/dL (ref 0.50–1.10)
Chloride: 100 mmol/L (ref 96–112)
GFR calc Af Amer: >90 mL/min (ref >90)
GFR calc non Af Amer: >90 mL/min (ref >90)
GLUCOSE: 100 mg/dL — AB (ref 70–99)
Potassium: 4.2 mmol/L (ref 3.5–5.1)
Sodium: 130 mmol/L — ABNORMAL LOW (ref 135–145)
Total Bilirubin: 0.5 mg/dL (ref 0.3–1.2)
Total Protein: 7 g/dL (ref 6.0–8.3)

## 2015-01-11 LAB — URINALYSIS, ROUTINE W REFLEX MICROSCOPIC
BILIRUBIN URINE: NEGATIVE
Glucose, UA: NEGATIVE mg/dL
HGB URINE DIPSTICK: NEGATIVE
Ketones, ur: NEGATIVE mg/dL
Nitrite: NEGATIVE
PH: 6 (ref 5.0–8.0)
Protein, ur: 30 mg/dL — AB
Specific Gravity, Urine: 1.025 (ref 1.005–1.030)
Urobilinogen, UA: 1 mg/dL (ref 0.0–1.0)

## 2015-01-11 LAB — CBC
HCT: 32.3 % — ABNORMAL LOW (ref 36.0–46.0)
Hemoglobin: 11.2 g/dL — ABNORMAL LOW (ref 12.0–15.0)
MCH: 31.6 pg (ref 26.0–34.0)
MCHC: 34.7 g/dL (ref 30.0–36.0)
MCV: 91.2 fL (ref 78.0–100.0)
Platelets: 194 10*3/uL (ref 150–400)
RBC: 3.54 MIL/uL — ABNORMAL LOW (ref 3.87–5.11)
RDW: 12.3 % (ref 11.5–15.5)
WBC: 6.7 10*3/uL (ref 4.0–10.5)

## 2015-01-11 LAB — URINE MICROSCOPIC-ADD ON

## 2015-01-11 LAB — RAPID URINE DRUG SCREEN, HOSP PERFORMED
Amphetamines: NOT DETECTED
BENZODIAZEPINES: NOT DETECTED
Barbiturates: NOT DETECTED
Cocaine: NOT DETECTED
OPIATES: NOT DETECTED
Tetrahydrocannabinol: NOT DETECTED

## 2015-01-11 LAB — LACTATE DEHYDROGENASE: LDH: 129 U/L (ref 94–250)

## 2015-01-11 LAB — PROTEIN / CREATININE RATIO, URINE
Creatinine, Urine: 406 mg/dL
PROTEIN CREATININE RATIO: 0.11 (ref 0.00–0.15)
TOTAL PROTEIN, URINE: 45 mg/dL

## 2015-01-11 LAB — AMYLASE: Amylase: 81 U/L (ref 0–105)

## 2015-01-11 LAB — URIC ACID: URIC ACID, SERUM: 2.9 mg/dL (ref 2.4–7.0)

## 2015-01-11 LAB — LIPASE, BLOOD: LIPASE: 22 U/L (ref 11–59)

## 2015-01-11 MED ORDER — CEPHALEXIN 500 MG PO CAPS: 500.0000 mg | ORAL_CAPSULE | Freq: Two times a day (BID) | ORAL | Status: DC

## 2015-01-11 MED ORDER — LACTATED RINGERS IV BOLUS (SEPSIS)
1000.0000 mL | Freq: Once | INTRAVENOUS | Status: AC
  Administered 2015-01-11: 1000 mL via INTRAVENOUS

## 2015-01-11 NOTE — MAU Note (Signed)
Pt presents to MAU via [facility] by EMS. [place] and stated her body was shutting down, pt appears sleepy and lethargic and somewhat restless. States she feels like she's going to pass out and that her whole body is tingling.

## 2015-01-11 NOTE — Discharge Instructions (Signed)
Nausea and Vomiting °Nausea is a sick feeling that often comes before throwing up (vomiting). Vomiting is a reflex where stomach contents come out of your mouth. Vomiting can cause severe loss of body fluids (dehydration). Children and elderly adults can become dehydrated quickly, especially if they also have diarrhea. Nausea and vomiting are symptoms of a condition or disease. It is important to find the cause of your symptoms. °CAUSES  °· Direct irritation of the stomach lining. This irritation can result from increased acid production (gastroesophageal reflux disease), infection, food poisoning, taking certain medicines (such as nonsteroidal anti-inflammatory drugs), alcohol use, or tobacco use. °· Signals from the brain. These signals could be caused by a headache, heat exposure, an inner ear disturbance, increased pressure in the brain from injury, infection, a tumor, or a concussion, pain, emotional stimulus, or metabolic problems. °· An obstruction in the gastrointestinal tract (bowel obstruction). °· Illnesses such as diabetes, hepatitis, gallbladder problems, appendicitis, kidney problems, cancer, sepsis, atypical symptoms of a heart attack, or eating disorders. °· Medical treatments such as chemotherapy and radiation. °· Receiving medicine that makes you sleep (general anesthetic) during surgery. °DIAGNOSIS °Your caregiver may ask for tests to be done if the problems do not improve after a few days. Tests may also be done if symptoms are severe or if the reason for the nausea and vomiting is not clear. Tests may include: °· Urine tests. °· Blood tests. °· Stool tests. °· Cultures (to look for evidence of infection). °· X-rays or other imaging studies. °Test results can help your caregiver make decisions about treatment or the need for additional tests. °TREATMENT °You need to stay well hydrated. Drink frequently but in small amounts. You may wish to drink water, sports drinks, clear broth, or eat frozen  ice pops or gelatin dessert to help stay hydrated. When you eat, eating slowly may help prevent nausea. There are also some antinausea medicines that may help prevent nausea. °HOME CARE INSTRUCTIONS  °· Take all medicine as directed by your caregiver. °· If you do not have an appetite, do not force yourself to eat. However, you must continue to drink fluids. °· If you have an appetite, eat a normal diet unless your caregiver tells you differently. °¨ Eat a variety of complex carbohydrates (rice, wheat, potatoes, bread), lean meats, yogurt, fruits, and vegetables. °¨ Avoid high-fat foods because they are more difficult to digest. °· Drink enough water and fluids to keep your urine clear or pale yellow. °· If you are dehydrated, ask your caregiver for specific rehydration instructions. Signs of dehydration may include: °¨ Severe thirst. °¨ Dry lips and mouth. °¨ Dizziness. °¨ Dark urine. °¨ Decreasing urine frequency and amount. °¨ Confusion. °¨ Rapid breathing or pulse. °SEEK IMMEDIATE MEDICAL CARE IF:  °· You have blood or brown flecks (like coffee grounds) in your vomit. °· You have black or bloody stools. °· You have a severe headache or stiff neck. °· You are confused. °· You have severe abdominal pain. °· You have chest pain or trouble breathing. °· You do not urinate at least once every 8 hours. °· You develop cold or clammy skin. °· You continue to vomit for longer than 24 to 48 hours. °· You have a fever. °MAKE SURE YOU:  °· Understand these instructions. °· Will watch your condition. °· Will get help right away if you are not doing well or get worse. °Document Released: 11/27/2005 Document Revised: 02/19/2012 Document Reviewed: 04/26/2011 °ExitCare® Patient Information ©2015 [employer]. This information is not intended   to replace advice given to you by your health care provider. Make sure you discuss any questions you have with your health care provider. Fetal Movement Counts Patient Name:  __________________________________________________ Patient Due Date: ____________________ Performing a fetal movement count is highly recommended in high-risk pregnancies, but it is good for every pregnant woman to do. Your health care provider may ask you to start counting fetal movements at 28 weeks of the pregnancy. Fetal movements often increase:  After eating a full meal.  After physical activity.  After eating or drinking something sweet or cold.  At rest. Pay attention to when you feel the baby is most active. This will help you notice a pattern of your baby's sleep and wake cycles and what factors contribute to an increase in fetal movement. It is important to perform a fetal movement count at the same time each day when your baby is normally most active.  HOW TO COUNT FETAL MOVEMENTS  Find a quiet and comfortable area to sit or lie down on your left side. Lying on your left side provides the best blood and oxygen circulation to your baby.  Write down the day and time on a sheet of paper or in a journal.  Start counting kicks, flutters, swishes, rolls, or jabs in a 2-hour period. You should feel at least 10 movements within 2 hours.  If you do not feel 10 movements in 2 hours, wait 2-3 hours and count again. Look for a change in the pattern or not enough counts in 2 hours. SEEK MEDICAL CARE IF:  You feel less than 10 counts in 2 hours, tried twice.  There is no movement in over an hour.  The pattern is changing or taking longer each day to reach 10 counts in 2 hours.  You feel the baby is not moving as he or she usually does. Date: ____________ Movements: ____________ Start time: ____________ Doreatha Martin time: ____________  Date: ____________ Movements: ____________ Start time: ____________ Doreatha Martin time: ____________ Date: ____________ Movements: ____________ Start time: ____________ Doreatha Martin time: ____________ Date: ____________ Movements: ____________ Start time: ____________ Doreatha Martin  time: ____________ Date: ____________ Movements: ____________ Start time: ____________ Doreatha Martin time: ____________ Date: ____________ Movements: ____________ Start time: ____________ Doreatha Martin time: ____________ Date: ____________ Movements: ____________ Start time: ____________ Doreatha Martin time: ____________ Date: ____________ Movements: ____________ Start time: ____________ Doreatha Martin time: ____________  Date: ____________ Movements: ____________ Start time: ____________ Doreatha Martin time: ____________ Date: ____________ Movements: ____________ Start time: ____________ Doreatha Martin time: ____________ Date: ____________ Movements: ____________ Start time: ____________ Doreatha Martin time: ____________ Date: ____________ Movements: ____________ Start time: ____________ Doreatha Martin time: ____________ Date: ____________ Movements: ____________ Start time: ____________ Doreatha Martin time: ____________ Date: ____________ Movements: ____________ Start time: ____________ Doreatha Martin time: ____________ Date: ____________ Movements: ____________ Start time: ____________ Doreatha Martin time: ____________  Date: ____________ Movements: ____________ Start time: ____________ Doreatha Martin time: ____________ Date: ____________ Movements: ____________ Start time: ____________ Doreatha Martin time: ____________ Date: ____________ Movements: ____________ Start time: ____________ Doreatha Martin time: ____________ Date: ____________ Movements: ____________ Start time: ____________ Doreatha Martin time: ____________ Date: ____________ Movements: ____________ Start time: ____________ Doreatha Martin time: ____________ Date: ____________ Movements: ____________ Start time: ____________ Doreatha Martin time: ____________ Date: ____________ Movements: ____________ Start time: ____________ Doreatha Martin time: ____________  Date: ____________ Movements: ____________ Start time: ____________ Doreatha Martin time: ____________ Date: ____________ Movements: ____________ Start time: ____________ Doreatha Martin time: ____________ Date: ____________  Movements: ____________ Start time: ____________ Doreatha Martin time: ____________ Date: ____________ Movements: ____________ Start time: ____________ Doreatha Martin time: ____________ Date: ____________ Movements: ____________ Start time: ____________ Doreatha Martin time: ____________ Date: ____________ Movements: ____________  Start time: ____________ Doreatha MartinFinish time: ____________ Date: ____________ Movements: ____________ Start time: ____________ Doreatha MartinFinish time: ____________  Date: ____________ Movements: ____________ Start time: ____________ Doreatha MartinFinish time: ____________ Date: ____________ Movements: ____________ Start time: ____________ Doreatha MartinFinish time: ____________ Date: ____________ Movements: ____________ Start time: ____________ Doreatha MartinFinish time: ____________ Date: ____________ Movements: ____________ Start time: ____________ Doreatha MartinFinish time: ____________ Date: ____________ Movements: ____________ Start time: ____________ Doreatha MartinFinish time: ____________ Date: ____________ Movements: ____________ Start time: ____________ Doreatha MartinFinish time: ____________ Date: ____________ Movements: ____________ Start time: ____________ Doreatha MartinFinish time: ____________  Date: ____________ Movements: ____________ Start time: ____________ Doreatha MartinFinish time: ____________ Date: ____________ Movements: ____________ Start time: ____________ Doreatha MartinFinish time: ____________ Date: ____________ Movements: ____________ Start time: ____________ Doreatha MartinFinish time: ____________ Date: ____________ Movements: ____________ Start time: ____________ Doreatha MartinFinish time: ____________ Date: ____________ Movements: ____________ Start time: ____________ Doreatha MartinFinish time: ____________ Date: ____________ Movements: ____________ Start time: ____________ Doreatha MartinFinish time: ____________ Date: ____________ Movements: ____________ Start time: ____________ Doreatha MartinFinish time: ____________  Date: ____________ Movements: ____________ Start time: ____________ Doreatha MartinFinish time: ____________ Date: ____________ Movements: ____________ Start time:  ____________ Doreatha MartinFinish time: ____________ Date: ____________ Movements: ____________ Start time: ____________ Doreatha MartinFinish time: ____________ Date: ____________ Movements: ____________ Start time: ____________ Doreatha MartinFinish time: ____________ Date: ____________ Movements: ____________ Start time: ____________ Doreatha MartinFinish time: ____________ Date: ____________ Movements: ____________ Start time: ____________ Doreatha MartinFinish time: ____________ Date: ____________ Movements: ____________ Start time: ____________ Doreatha MartinFinish time: ____________  Date: ____________ Movements: ____________ Start time: ____________ Doreatha MartinFinish time: ____________ Date: ____________ Movements: ____________ Start time: ____________ Doreatha MartinFinish time: ____________ Date: ____________ Movements: ____________ Start time: ____________ Doreatha MartinFinish time: ____________ Date: ____________ Movements: ____________ Start time: ____________ Doreatha MartinFinish time: ____________ Date: ____________ Movements: ____________ Start time: ____________ Doreatha MartinFinish time: ____________ Date: ____________ Movements: ____________ Start time: ____________ Doreatha MartinFinish time: ____________ Document Released: 12/27/2006 Document Revised: 04/13/2014 Document Reviewed: 09/23/2012 ExitCare Patient Information 2015 [place]. This information is not intended to replace advice given to you by your health care provider. Make sure you discuss any questions you have with your health care provider. Asymptomatic Bacteriuria Asymptomatic bacteriuria is the presence of a large number of bacteria in your urine without the usual symptoms of burning or frequent urination. The following conditions increase the risk of asymptomatic bacteriuria:  Diabetes mellitus.  Advanced age.  Pregnancy in the first trimester.  Kidney stones.  Kidney transplants.  Leaky kidney tube valve in young children (reflux). Treatment for this condition is not needed in most people and can lead to other problems such as too much yeast and growth of  resistant bacteria. However, some people, such as pregnant women, do need treatment to prevent kidney infection. Asymptomatic bacteriuria in pregnancy is also associated with fetal growth restriction, premature labor, and newborn death. HOME CARE INSTRUCTIONS Monitor your condition for any changes. The following actions may help to relieve any discomfort you are feeling:  Drink enough water and fluids to keep your urine clear or pale yellow. Go to the bathroom more often to keep your bladder empty.  Keep the area around your vagina and rectum clean. Wipe yourself from front to back after urinating. SEEK IMMEDIATE MEDICAL CARE IF:  You develop signs of an infection such as:  Burning with urination.  Frequency of voiding.  Back pain.  Fever.  You have blood in the urine.  You develop a fever. MAKE SURE YOU:  Understand these instructions.  Will watch your condition.  Will get help right away if you are not doing well or get worse. Document Released: 11/27/2005 Document Revised: 04/13/2014 Document Reviewed: 05/19/2013 [facility] CenterExitCare Patient Information 2015 [place], [place]. This information  is not intended to replace advice given to you by your health care provider. Make sure you discuss any questions you have with your health care provider.

## 2015-01-11 NOTE — MAU Provider Note (Signed)
MAU Addendum Note  Results for orders placed or performed during the hospital encounter of 01/11/15 (from the past 24 hour(s))  Urinalysis, Routine w reflex microscopic     Status: Abnormal   Collection Time: 01/11/15  9:16 AM  Result Value Ref Range   Color, Urine YELLOW YELLOW   APPearance HAZY (A) CLEAR   Specific Gravity, Urine 1.025 1.005 - 1.030   pH 6.0 5.0 - 8.0   Glucose, UA NEGATIVE NEGATIVE mg/dL   Hgb urine dipstick NEGATIVE NEGATIVE   Bilirubin Urine NEGATIVE NEGATIVE   Ketones, ur NEGATIVE NEGATIVE mg/dL   Protein, ur 30 (A) NEGATIVE mg/dL   Urobilinogen, UA 1.0 0.0 - 1.0 mg/dL   Nitrite NEGATIVE NEGATIVE   Leukocytes, UA SMALL (A) NEGATIVE  Urine microscopic-add on     Status: Abnormal   Collection Time: 01/11/15  9:16 AM  Result Value Ref Range   Squamous Epithelial / LPF MANY (A) RARE   WBC, UA 3-6 <3 WBC/hpf   RBC / HPF 0-2 <3 RBC/hpf   Bacteria, UA MANY (A) RARE  Urine rapid drug screen (hosp performed)     Status: None   Collection Time: 01/11/15  9:16 AM  Result Value Ref Range   Opiates NONE DETECTED NONE DETECTED   Cocaine NONE DETECTED NONE DETECTED   Benzodiazepines NONE DETECTED NONE DETECTED   Amphetamines NONE DETECTED NONE DETECTED   Tetrahydrocannabinol NONE DETECTED NONE DETECTED   Barbiturates NONE DETECTED NONE DETECTED  Protein / creatinine ratio, urine     Status: None   Collection Time: 01/11/15  9:16 AM  Result Value Ref Range   Creatinine, Urine 406.00 mg/dL   Total Protein, Urine 45 mg/dL   Protein Creatinine Ratio 0.11 0.00 - 0.15  CBC     Status: Abnormal   Collection Time: 01/11/15 10:40 AM  Result Value Ref Range   WBC 6.7 4.0 - 10.5 K/uL   RBC 3.54 (L) 3.87 - 5.11 MIL/uL   Hemoglobin 11.2 (L) 12.0 - 15.0 g/dL   HCT 16.1 (L) 09.6 - 04.5 %   MCV 91.2 78.0 - 100.0 fL   MCH 31.6 26.0 - 34.0 pg   MCHC 34.7 30.0 - 36.0 g/dL   RDW 40.9 81.1 - 91.4 %   Platelets 194 150 - 400 K/uL  Comprehensive metabolic panel     Status:  Abnormal   Collection Time: 01/11/15 10:40 AM  Result Value Ref Range   Sodium 130 (L) 135 - 145 mmol/L   Potassium 4.2 3.5 - 5.1 mmol/L   Chloride 100 96 - 112 mmol/L   CO2 22 19 - 32 mmol/L   Glucose, Bld 100 (H) 70 - 99 mg/dL   BUN 13 6 - 23 mg/dL   Creatinine, Ser 7.82 0.50 - 1.10 mg/dL   Calcium 9.3 8.4 - 95.6 mg/dL   Total Protein 7.0 6.0 - 8.3 g/dL   Albumin 3.0 (L) 3.5 - 5.2 g/dL   AST 19 0 - 37 U/L   ALT 12 0 - 35 U/L   Alkaline Phosphatase 90 39 - 117 U/L   Total Bilirubin 0.5 0.3 - 1.2 mg/dL   GFR calc non Af Amer >90 >90 mL/min   GFR calc Af Amer >90 >90 mL/min   Anion gap 8 5 - 15  Amylase     Status: None   Collection Time: 01/11/15 10:40 AM  Result Value Ref Range   Amylase 81 0 - 105 U/L  Lipase, blood     Status:  None   Collection Time: 01/11/15 10:40 AM  Result Value Ref Range   Lipase 22 11 - 59 U/L  Lactate dehydrogenase     Status: None   Collection Time: 01/11/15 10:40 AM  Result Value Ref Range   LDH 129 94 - 250 U/L  Uric acid     Status: None   Collection Time: 01/11/15 10:40 AM  Result Value Ref Range   Uric Acid, Serum 2.9 2.4 - 7.0 mg/dL     Plan: -Rx for Keflex for UTI -Discussed need to follow up in office later this week -Bleeding and PTL Precautions -Kick counts -Encouraged to call if any questions or concerns arise prior to next scheduled office visit.  -Discharged to home in stable condition Consulted with Dr. Burna FortsKulwa   Dub Maclellan, CNM, MSN 01/11/2015. 5:01 PM

## 2015-01-11 NOTE — MAU Note (Signed)
Pt in via EMS for nausea and dizziness. States was walking to bus stop when she had to sit down r/t feeling dizzy. Called EMS to take her here. Has had nausea all wknd, and one instance of vomiting. Was having diarrhea days ago, and is now constipated. Has been having intermittent contractions. Issues with placenta, and is due for u/s tomorrow to evaluate placenta.

## 2015-01-11 NOTE — MAU Provider Note (Signed)
MAU Addendum Note  Results for orders placed or performed during the hospital encounter of 01/11/15 (from the past 24 hour(s))  Urinalysis, Routine w reflex microscopic     Status: Abnormal   Collection Time: 01/11/15  9:16 AM  Result Value Ref Range   Color, Urine YELLOW YELLOW   APPearance HAZY (A) CLEAR   Specific Gravity, Urine 1.025 1.005 - 1.030   pH 6.0 5.0 - 8.0   Glucose, UA NEGATIVE NEGATIVE mg/dL   Hgb urine dipstick NEGATIVE NEGATIVE   Bilirubin Urine NEGATIVE NEGATIVE   Ketones, ur NEGATIVE NEGATIVE mg/dL   Protein, ur 30 (A) NEGATIVE mg/dL   Urobilinogen, UA 1.0 0.0 - 1.0 mg/dL   Nitrite NEGATIVE NEGATIVE   Leukocytes, UA SMALL (A) NEGATIVE  Urine microscopic-add on     Status: Abnormal   Collection Time: 01/11/15  9:16 AM  Result Value Ref Range   Squamous Epithelial / LPF MANY (A) RARE   WBC, UA 3-6 <3 WBC/hpf   RBC / HPF 0-2 <3 RBC/hpf   Bacteria, UA MANY (A) RARE  CBC     Status: Abnormal   Collection Time: 01/11/15 10:40 AM  Result Value Ref Range   WBC 6.7 4.0 - 10.5 K/uL   RBC 3.54 (L) 3.87 - 5.11 MIL/uL   Hemoglobin 11.2 (L) 12.0 - 15.0 g/dL   HCT 09.832.3 (L) 11.936.0 - 14.746.0 %   MCV 91.2 78.0 - 100.0 fL   MCH 31.6 26.0 - 34.0 pg   MCHC 34.7 30.0 - 36.0 g/dL   RDW 82.912.3 56.211.5 - 13.015.5 %   Platelets 194 150 - 400 K/uL  Comprehensive metabolic panel     Status: Abnormal   Collection Time: 01/11/15 10:40 AM  Result Value Ref Range   Sodium 130 (L) 135 - 145 mmol/L   Potassium 4.2 3.5 - 5.1 mmol/L   Chloride 100 96 - 112 mmol/L   CO2 22 19 - 32 mmol/L   Glucose, Bld 100 (H) 70 - 99 mg/dL   BUN 13 6 - 23 mg/dL   Creatinine, Ser 8.650.65 0.50 - 1.10 mg/dL   Calcium 9.3 8.4 - 78.410.5 mg/dL   Total Protein 7.0 6.0 - 8.3 g/dL   Albumin 3.0 (L) 3.5 - 5.2 g/dL   AST 19 0 - 37 U/L   ALT 12 0 - 35 U/L   Alkaline Phosphatase 90 39 - 117 U/L   Total Bilirubin 0.5 0.3 - 1.2 mg/dL   GFR calc non Af Amer >90 >90 mL/min   GFR calc Af Amer >90 >90 mL/min   Anion gap 8 5 - 15      Plan: LDH. Uric acid, PCR  Ramah Langhans, CNM, MSN 01/11/2015. 11:46 AM

## 2015-01-11 NOTE — MAU Provider Note (Signed)
Carla CavesLatoya Schlitt is a 34 y.o. G4P1021 at 36.4 weeks pt present to MAU via EMS c/o n/v, dizziness & weakness.  She denies vb, lof or ctx. Her speech is very slurred and she is unable to focus.  She denies taking anything else but zofran for the nausea.  Hx remarkable MRI negative for accreta. LL placenta seen on MRI, marginal previa noted on recent US at [facility]. Succenturiate lobe also noted.  FU US schedule for tomorrow.   History     Patient Active Problem List   Diagnosis Date Noted  . Viral syndrome 11/23/2014  . Marginal placenta previa   . Low lying placenta without hemorrhage, antepartum--no evidence of accreta on MRI 11/20/14 10/13/2014  . 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 Social problem 10/13/2014    Chief Complaint  Patient presents with  . Dizziness  . Nausea   HPI  OB History    Gravida Para Term Preterm AB TAB SAB Ectopic Multiple Living   4 1 1  2 1 1   1       Past Medical History  Diagnosis Date  . Anemia   . Headache     Past Surgical History  Procedure Laterality Date  . Hernia repair    . Cesarean section    . Dilation and curettage of uterus    . Wisdom tooth extraction      No family history on file.  History  Substance Use Topics  . Smoking status: Never Smoker   . Smokeless tobacco: Never Used  . Alcohol Use: No    Allergies: No Known Allergies  Prescriptions prior to admission  Medication Sig Dispense Refill Last Dose  . acetaminophen (TYLENOL) 325 MG tablet Take 650 mg by mouth every 6 (six) hours as needed for mild pain or moderate pain.    Past Month at Unknown time  . ondansetron (ZOFRAN) 4 MG tablet Take 1 tablet (4 mg total) by mouth every 8 (eight) hours as needed for nausea or vomiting. 20 tablet 0 01/11/2015 at Unknown time  . Prenatal Vit-Fe Fumarate-FA (PRENATAL MULTIVITAMIN) TABS tablet Take 1 tablet by mouth daily at 12 noon.   01/11/2015 at Unknown time    ROS See HPI  above, all other systems are negative  Physical Exam   Blood pressure 114/72, pulse 89, temperature 97.9 F (36.6 C), temperature source Oral, resp. rate 18, weight 183 lb (83.008 kg), last menstrual period 04/11/2014.  Physical Exam Ext:  WNL ABD: Soft, non tender to palpation, no rebound or guarding SVE: deferred   ED Course  Assessment: IUP at  36.4 weeks Membranes: intact FHR: Category 1 CTX: none   Plan: Labs: UDS, CBC, CMP, amylase, lipase, urine culture    Katiya Fike, CNM, MSN 01/11/2015. 10:24 AM

## 2015-01-12 ENCOUNTER — Other Ambulatory Visit (HOSPITAL_COMMUNITY): Payer: Self-pay | Admitting: Obstetrics and Gynecology

## 2015-01-12 ENCOUNTER — Ambulatory Visit (HOSPITAL_COMMUNITY)
Admission: AD | Admit: 2015-01-12 | Discharge: 2015-01-12 | Disposition: A | Discharge status: Home / Self Care | Payer: Medicaid Other | Source: Ambulatory Visit | Attending: Obstetrics & Gynecology | Admitting: Obstetrics & Gynecology

## 2015-01-12 ENCOUNTER — Inpatient Hospital Stay (HOSPITAL_COMMUNITY)
Admission: RE | Admit: 2015-01-12 | Discharge: 2015-01-12 | Disposition: A | Discharge status: Home / Self Care | Payer: Medicaid Other | Source: Ambulatory Visit | Attending: Obstetrics and Gynecology | Admitting: Obstetrics and Gynecology

## 2015-01-12 ENCOUNTER — Other Ambulatory Visit (HOSPITAL_COMMUNITY): Payer: Self-pay

## 2015-01-12 ENCOUNTER — Encounter (HOSPITAL_COMMUNITY): Payer: Self-pay

## 2015-01-12 VITALS — BP 121/86 | HR 82

## 2015-01-12 DIAGNOSIS — O4403 Placenta previa specified as without hemorrhage, third trimester: Secondary | ICD-10-CM

## 2015-01-12 DIAGNOSIS — O4402 Placenta previa specified as without hemorrhage, second trimester: Secondary | ICD-10-CM

## 2015-01-12 DIAGNOSIS — N856 Intrauterine synechiae: Secondary | ICD-10-CM

## 2015-01-12 LAB — URINE CULTURE: Colony Count: 50000

## 2015-01-12 NOTE — ED Notes (Signed)
Patient asked for RN to come to waiting area.  Patient states she is feeling dizzy and light-headed. Vital signs done and in chart. Patient had same symptoms yesterday and was taken to MAU via EMS. Notes, labs available for assessment.  Patient was discharged home.  Patient brought to US room. MD will see patient after US for consult.

## 2015-01-14 ENCOUNTER — Other Ambulatory Visit: Payer: Self-pay | Admitting: Obstetrics and Gynecology

## 2015-01-26 ENCOUNTER — Inpatient Hospital Stay (HOSPITAL_COMMUNITY)
Admission: AD | Admit: 2015-01-26 | Discharge: 2015-01-26 | Disposition: A | Discharge status: Home / Self Care | Payer: Medicaid Other | Source: Ambulatory Visit | Attending: Obstetrics and Gynecology | Admitting: Obstetrics and Gynecology

## 2015-01-26 ENCOUNTER — Encounter (HOSPITAL_COMMUNITY): Payer: Self-pay

## 2015-01-26 ENCOUNTER — Encounter (HOSPITAL_COMMUNITY): Payer: Self-pay | Admitting: *Deleted

## 2015-01-26 DIAGNOSIS — O368130 Decreased fetal movements, third trimester, not applicable or unspecified: Secondary | ICD-10-CM | POA: Diagnosis not present

## 2015-01-26 DIAGNOSIS — O3421 Maternal care for scar from previous cesarean delivery: Secondary | ICD-10-CM | POA: Insufficient documentation

## 2015-01-26 DIAGNOSIS — O4693 Antepartum hemorrhage, unspecified, third trimester: Secondary | ICD-10-CM | POA: Diagnosis present

## 2015-01-26 DIAGNOSIS — N856 Intrauterine synechiae: Secondary | ICD-10-CM

## 2015-01-26 DIAGNOSIS — Z3A38 38 weeks gestation of pregnancy: Secondary | ICD-10-CM | POA: Insufficient documentation

## 2015-01-26 DIAGNOSIS — O34219 Maternal care for unspecified type scar from previous cesarean delivery: Secondary | ICD-10-CM

## 2015-01-26 NOTE — MAU Provider Note (Signed)
Carla CavesLatoya Rodgers is a 34 y.o. G4P1021 at 38.5 weeks present to MAU c/o VB when she went to the toilet.  She denies lof and ctx w/+FM.  She report she has a CS scheduled for previa this Friday.   History     Patient Active Problem List   Diagnosis Date Noted  . [redacted] weeks gestation of pregnancy   . Viral syndrome 11/23/2014  . Marginal placenta previa   . Low lying placenta without hemorrhage, antepartum--no evidence of accreta on MRI 11/20/14 10/13/2014  . 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 Social problem 10/13/2014    Chief Complaint  Patient presents with  . Vaginal Bleeding   HPI  OB History    Gravida Para Term Preterm AB TAB SAB Ectopic Multiple Living   4 1 1  2 1 1   1       Past Medical History  Diagnosis Date  . Anemia   . Headache     Past Surgical History  Procedure Laterality Date  . Hernia repair    . Cesarean section    . Dilation and curettage of uterus    . Wisdom tooth extraction      No family history on file.  History  Substance Use Topics  . Smoking status: Never Smoker   . Smokeless tobacco: Never Used  . Alcohol Use: No    Allergies: No Known Allergies  Prescriptions prior to admission  Medication Sig Dispense Refill Last Dose  . ondansetron (ZOFRAN) 4 MG tablet Take 1 tablet (4 mg total) by mouth every 8 (eight) hours as needed for nausea or vomiting. 20 tablet 0 Past Week at Unknown time  . Prenatal Vit-Fe Fumarate-FA (PRENATAL MULTIVITAMIN) TABS tablet Take 1 tablet by mouth daily at 12 noon.   01/26/2015 at Unknown time  . cephALEXin (KEFLEX) 500 MG capsule Take 1 capsule (500 mg total) by mouth 2 (two) times daily. (Patient not taking: Reported on 01/26/2015) 14 capsule 0     ROS See HPI above, all other systems are negative  Physical Exam   Blood pressure 112/75, pulse 107, temperature 97.5 F (36.4 C), temperature source Oral, resp. rate 16, height 5\' 8"  (1.727 m), weight  189 lb (85.73 kg), last menstrual period 04/11/2014, SpO2 100 %.  Physical Exam Ext:  WNL ABD: Soft, non tender to palpation, no rebound or guarding SVE: c/t/h SSE: NO bleeding noted   ED Course  Assessment: IUP at  38.5 weeks Membranes: intact FHR: Category 1, FHR 150, moderate variability, + accel, no decel noted in the last 20 minutes CTX:  None, irritability noted     Plan: DC to home in stable condition Pt to keep her pre OP appointment tomorrow    Hollye Pritt, CNM, MSN 01/26/2015. 8:33 PM

## 2015-01-26 NOTE — MAU Note (Signed)
Pt reports she had some bleeding since about ago. Some cramping.

## 2015-01-26 NOTE — Discharge Instructions (Signed)

## 2015-01-27 ENCOUNTER — Inpatient Hospital Stay (HOSPITAL_COMMUNITY)
Admission: AD | Admit: 2015-01-27 | Discharge: 2015-01-31 | DRG: 765 | Disposition: A | Discharge status: Home / Self Care | Payer: Medicaid Other | Source: Ambulatory Visit | Attending: Obstetrics and Gynecology | Admitting: Obstetrics and Gynecology

## 2015-01-27 ENCOUNTER — Encounter (HOSPITAL_COMMUNITY): Payer: Self-pay

## 2015-01-27 ENCOUNTER — Encounter (HOSPITAL_COMMUNITY)
Admission: RE | Admit: 2015-01-27 | Discharge: 2015-01-27 | Disposition: A | Discharge status: Home / Self Care | Payer: Medicaid Other | Source: Ambulatory Visit | Attending: Obstetrics and Gynecology | Admitting: Obstetrics and Gynecology

## 2015-01-27 ENCOUNTER — Inpatient Hospital Stay (HOSPITAL_COMMUNITY)
Admission: AD | Admit: 2015-01-27 | Discharge: 2015-01-27 | Disposition: A | Discharge status: Home / Self Care | Payer: Medicaid Other | Source: Ambulatory Visit | Attending: Obstetrics and Gynecology | Admitting: Obstetrics and Gynecology

## 2015-01-27 ENCOUNTER — Encounter (HOSPITAL_COMMUNITY): Payer: Self-pay | Admitting: *Deleted

## 2015-01-27 VITALS — BP 98/75 | HR 100 | Temp 98.8°F | Resp 18 | Ht 68.0 in | Wt 189.0 lb

## 2015-01-27 DIAGNOSIS — O368190 Decreased fetal movements, unspecified trimester, not applicable or unspecified: Secondary | ICD-10-CM | POA: Diagnosis present

## 2015-01-27 DIAGNOSIS — O4403 Placenta previa specified as without hemorrhage, third trimester: Secondary | ICD-10-CM | POA: Diagnosis present

## 2015-01-27 DIAGNOSIS — O3421 Maternal care for scar from previous cesarean delivery: Principal | ICD-10-CM | POA: Diagnosis present

## 2015-01-27 DIAGNOSIS — O34219 Maternal care for unspecified type scar from previous cesarean delivery: Secondary | ICD-10-CM

## 2015-01-27 DIAGNOSIS — D649 Anemia, unspecified: Secondary | ICD-10-CM | POA: Diagnosis present

## 2015-01-27 DIAGNOSIS — O9902 Anemia complicating childbirth: Secondary | ICD-10-CM | POA: Diagnosis present

## 2015-01-27 DIAGNOSIS — O368130 Decreased fetal movements, third trimester, not applicable or unspecified: Secondary | ICD-10-CM | POA: Diagnosis present

## 2015-01-27 DIAGNOSIS — N856 Intrauterine synechiae: Secondary | ICD-10-CM

## 2015-01-27 DIAGNOSIS — Z3A39 39 weeks gestation of pregnancy: Secondary | ICD-10-CM | POA: Insufficient documentation

## 2015-01-27 DIAGNOSIS — Z98891 History of uterine scar from previous surgery: Secondary | ICD-10-CM

## 2015-01-27 DIAGNOSIS — O4402 Placenta previa specified as without hemorrhage, second trimester: Secondary | ICD-10-CM

## 2015-01-27 LAB — CBC
HCT: 32.7 % — ABNORMAL LOW (ref 36.0–46.0)
Hemoglobin: 11.2 g/dL — ABNORMAL LOW (ref 12.0–15.0)
MCH: 31.1 pg (ref 26.0–34.0)
MCHC: 34.3 g/dL (ref 30.0–36.0)
MCV: 90.8 fL (ref 78.0–100.0)
PLATELETS: 209 10*3/uL (ref 150–400)
RBC: 3.6 MIL/uL — AB (ref 3.87–5.11)
RDW: 12.6 % (ref 11.5–15.5)
WBC: 5.7 10*3/uL (ref 4.0–10.5)

## 2015-01-27 LAB — TYPE AND SCREEN
ABO/RH(D): O POS
Antibody Screen: NEGATIVE

## 2015-01-27 LAB — ABO/RH: ABO/RH(D): O POS

## 2015-01-27 MED ORDER — CEFAZOLIN SODIUM-DEXTROSE 2-3 GM-% IV SOLR
2.0000 g | Freq: Once | INTRAVENOUS | Status: AC
  Administered 2015-01-28: 2 g via INTRAVENOUS
  Filled 2015-01-27: qty 50

## 2015-01-27 NOTE — Discharge Instructions (Signed)
Braxton Hicks Contractions °Contractions of the uterus can occur throughout pregnancy. Contractions are not always a sign that you are in labor.  °WHAT ARE BRAXTON HICKS CONTRACTIONS?  °Contractions that occur before labor are called Braxton Hicks contractions, or false labor. Toward the end of pregnancy (32-34 weeks), these contractions can develop more often and may become more forceful. This is not true labor because these contractions do not result in opening (dilatation) and thinning of the cervix. They are sometimes difficult to tell apart from true labor because these contractions can be forceful and people have different pain tolerances. You should not feel embarrassed if you go to the hospital with false labor. Sometimes, the only way to tell if you are in true labor is for your health care provider to look for changes in the cervix. °If there are no prenatal problems or other health problems associated with the pregnancy, it is completely safe to be sent home with false labor and await the onset of true labor. °HOW CAN YOU TELL THE DIFFERENCE BETWEEN TRUE AND FALSE LABOR? °False Labor °· The contractions of false labor are usually shorter and not as hard as those of true labor.   °· The contractions are usually irregular.   °· The contractions are often felt in the front of the lower abdomen and in the groin.   °· The contractions may go away when you walk around or change positions while lying down.   °· The contractions get weaker and are shorter lasting as time goes on.   °· The contractions do not usually become progressively stronger, regular, and closer together as with true labor.   °True Labor °· Contractions in true labor last 30-70 seconds, become very regular, usually become more intense, and increase in frequency.   °· The contractions do not go away with walking.   °· The discomfort is usually felt in the top of the uterus and spreads to the lower abdomen and low back.   °· True labor can be  determined by your health care provider with an exam. This will show that the cervix is dilating and getting thinner.   °WHAT TO REMEMBER °· Keep up with your usual exercises and follow other instructions given by your health care provider.   °· Take medicines as directed by your health care provider.   °· Keep your regular prenatal appointments.   °· Eat and drink lightly if you think you are going into labor.   °· If Braxton Hicks contractions are making you uncomfortable:   °¨ Change your position from lying down or resting to walking, or from walking to resting.   °¨ Sit and rest in a tub of warm water.   °¨ Drink 2-3 glasses of water. Dehydration may cause these contractions.   °¨ Do slow and deep breathing several times an hour.   °WHEN SHOULD I SEEK IMMEDIATE MEDICAL CARE? °Seek immediate medical care if: °· Your contractions become stronger, more regular, and closer together.   °· You have fluid leaking or gushing from your vagina.   °· You have a fever.   °· You pass blood-tinged mucus.   °· You have vaginal bleeding.   °· You have continuous abdominal pain.   °· You have low back pain that you never had before.   °· You feel your baby's head pushing down and causing pelvic pressure.   °· Your baby is not moving as much as it used to.   °Document Released: 11/27/2005 Document Revised: 12/02/2013 Document Reviewed: 09/08/2013 °[employer]® Patient Information ©2015 [employer]. This information is not intended to replace advice given to you by your health care   provider. Make sure you discuss any questions you have with your health care provider. ° °

## 2015-01-27 NOTE — Patient Instructions (Addendum)
Your procedure is scheduled on:01/29/15  Enter through the Main Entrance at : 12 noon  Pick up desk phone and dial [phone] and inform us of your arrival.  Please call [phone] if you have any problems the morning of surgery.  Remember: Do not eat food after midnight:Thursday Clear liquids are ok until:930 am Fri   You may brush your teeth the morning of surgery.  DO NOT wear jewelry, eye make-up, lipstick,body lotion, or dark fingernail polish.  (Polished toes are ok) You may wear deodorant.  If you are to be admitted after surgery, leave suitcase in car until your room has been assigned. Patients discharged on the day of surgery will not be allowed to drive home. Wear loose fitting, comfortable clothes for your ride home.

## 2015-01-27 NOTE — MAU Note (Signed)
Pt presents with complaint of worsening contractions.  

## 2015-01-27 NOTE — MAU Note (Signed)
Pt presents from pre-op complaining of decreased fetal movment since yesterday. Also having contractions. Scheduled for c/s on 2/19. Some bloody show still.

## 2015-01-27 NOTE — MAU Provider Note (Signed)
History   34 yo G4p1021 at 5238 6/7 weeks presented from pre-op assessment with c/o irregular contractions and ? Decreased FM today.  Seen yesterday in MAU for vaginal bleeding--no bleeding noted on exam, cervix closed/long, mild irritability.  Now reporting UCs very irregular, q 2-20 min.  Scheduled for repeat C/S on 01/29/15.  Evaluated during pregnancy for possible accreta, with no evidence of that on MRI 11/20/14. Marginal previa noted on  12/1, with change to low-lying placenta on MRI 11/20/14, then resolution to anterior placenta without any evidence of previa or LLP on MFM US 01/12/15.  Reports small amount spotting since yesterday.    Patient Active Problem List   Diagnosis Date Noted  . Previous cesarean delivery, antepartum condition or complication 01/27/2015  . Decreased fetal movement 01/27/2015  . Marginal placenta previa   . Low lying placenta without hemorrhage, antepartum--no evidence of accreta on MRI 11/20/14 10/13/2014  . 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 Social problem 10/13/2014    Chief Complaint  Patient presents with  . Vaginal Bleeding   HPI:  As above  OB History    Gravida Para Term Preterm AB TAB SAB Ectopic Multiple Living   4 1 1  2 1 1   1       Past Medical History  Diagnosis Date  . Anemia   . Headache     Past Surgical History  Procedure Laterality Date  . Hernia repair    . Cesarean section    . Dilation and curettage of uterus    . Wisdom tooth extraction      No family history on file.  History  Substance Use Topics  . Smoking status: Never Smoker   . Smokeless tobacco: Never Used  . Alcohol Use: No    Allergies: No Known Allergies  Prescriptions prior to admission  Medication Sig Dispense Refill Last Dose  . cephALEXin (KEFLEX) 500 MG capsule Take 1 capsule (500 mg total) by mouth 2 (two) times daily. (Patient not taking: Reported on 01/26/2015) 14 capsule 0   .  ondansetron (ZOFRAN) 4 MG tablet Take 1 tablet (4 mg total) by mouth every 8 (eight) hours as needed for nausea or vomiting. 20 tablet 0 Past Week at Unknown time  . Prenatal Vit-Fe Fumarate-FA (PRENATAL MULTIVITAMIN) TABS tablet Take 1 tablet by mouth daily at 12 noon.   01/26/2015 at Unknown time    ROS:  Occasional cramping Physical Exam   Blood pressure 113/75, pulse 84, temperature 97.5 F (36.4 C), temperature source Oral, resp. rate 18, height 5\' 8"  (1.727 m), weight 189 lb (85.73 kg), last menstrual period 04/11/2014, SpO2 100 %.    Physical Exam  In NAD  Chest clear Heart RRR without murmur Abd gravid, NT Pelvic--deferred, but no blood on pad Ext WNL  FHR Category 1 UCs sporadic irritability  ED Course  Assessment: IUP at 38 6/7 weeks Previous C/S, repeat scheduled 01/29/15 Category 1 FHR No evidence labor  Plan: D/C home after NST Labor and FM precautions reviewed. [facility] scheduled C/S, call prn with any evidence of labor, SROM, or heavy vaginal bleeding.   Nigel BridgemanLATHAM, Marinna Blane CNM, MSN 01/27/2015 12:05 PM

## 2015-01-27 NOTE — H&P (Addendum)
Carla Rodgers is a 34 y.o. female presenting for contractions and vaginal bleeding. Reports active fetus. Denies LOF. Was scheduled for repeat c-section on 01/29/15. Maternal Medical History:  Reason for admission: Contractions and vaginal bleeding.   Contractions: Onset was 13-24 hours ago.   Frequency: irregular.   Duration is approximately 60 seconds.   Perceived severity is moderate.    Fetal activity: Perceived fetal activity is normal.   Last perceived fetal movement was within the past hour.    Prenatal complications: Bleeding.   Prenatal Complications - Diabetes: none.    OB History    Gravida Para Term Preterm AB TAB SAB Ectopic Multiple Living   Past Medical History  Diagnosis Date  . Anemia   . Headache    Past Surgical History  Procedure Laterality Date  . Hernia repair    . Cesarean section    . Dilation and curettage of uterus    . Wisdom tooth extraction     Family History: family history is not on file. Social History:  reports that she has never smoked. She has never used smokeless tobacco. She reports that she does not drink alcohol or use illicit drugs.   Prenatal Transfer Tool  Maternal Diabetes: No Genetic Screening: Normal Maternal Ultrasounds/Referrals: Abnormal:  Findings:   Other:Marginal placenta previa. Initially some suspicion of invasive placentation. MRI did not show evidence of placenta increta or percreta. Fetal Ultrasounds or other Referrals:  Referred to Materal Fetal Medicine  Maternal Substance Abuse:  No Significant Maternal Medications:  Meds include: Other: Ondansetron HCI, PNV Significant Maternal Lab Results:  Lab values include: Group B Strep negative Other Comments:  01/12/15 per MFM: Anterior placenta. No u/s findings suspicious for placenta accreta noted. Nevertheless, placenta accreta is a clinical diagnosis and may not be able to determine for certainty until time of delivery.  Review of Systems  All  other systems reviewed and are negative.   Dilation: 5 Effacement (%): 90 Station: -2 Exam by:: Avery Dennison RN Blood pressure 120/73, pulse 88, temperature 98.2 F (36.8 C), temperature source Oral, resp. rate 16, last menstrual period 04/11/2014. Maternal Exam:  Uterine Assessment: Contraction strength is moderate.  Contraction duration is 80 seconds. Contraction frequency is irregular.   Abdomen: Patient reports no abdominal tenderness. Surgical scars: low transverse.   Fundal height is CWD.   Estimated fetal weight is 7 1/4 lbs.   Fetal presentation: vertex  Introitus: Normal vulva. Normal vagina.  Ferning test: not done.  Nitrazine test: not done.  Cervix: Cervix evaluated by digital exam.     Fetal Exam Fetal Monitor Review: Mode: fetoscope.   Baseline rate: 140.  Variability: moderate (6-25 bpm).   Pattern: accelerations present.    Fetal State Assessment: Category I - tracings are normal.     Physical Exam  Nursing note and vitals reviewed. Constitutional: She is oriented to person, place, and time. She appears well-developed and well-nourished. She appears distressed.  HENT:  Head: Normocephalic and atraumatic.  Cardiovascular: Normal rate and regular rhythm.  Exam reveals no gallop and no friction rub.   No murmur heard. Respiratory: Effort normal and breath sounds normal.  GI: Soft. She exhibits no distension. There is no tenderness. There is no rebound and no guarding.  Genitourinary: Vagina normal.  Musculoskeletal: Normal range of motion.  Neurological: She is alert and oriented to person, place, and time. She has normal reflexes.  Skin: Skin  is warm and dry.    Prenatal labs: ABO, Rh: --/--/O POS, O POS (02/17 1115) Antibody: NEG (02/17 1115) Rubella:  Immune (09/10/2014) RPR: Nonreactive (10/01 0000)  HBsAg: Negative (10/01 0000)  HIV: Non-reactive (10/01 0000)  GBS:  Negative (01/10/2015)  Assessment: IUP at 39 wks Active labor H/O  c-section  Plan: Dr. Su Hiltoberts notified of pt's arrival and status Prepare for repeat c-section Ancef 2 g on call to OR     Sherre ScarletWILLIAMS, KIMBERLY 01/28/2015, 12:12 AM  Risks/Benefits/Alternatives discussed with the patient including but not limited to bleeding, infection and injury.  The discussion also included the possibility of c-hysterectomy secondary to question of accreta earlier on although that seems to be unlikely based on MRI results.  Questions answered and consent signed and witnessed.  This is urgent as the patient is actively laboring.

## 2015-01-27 NOTE — MAU Note (Signed)
Urine in lab 

## 2015-01-28 ENCOUNTER — Encounter (HOSPITAL_COMMUNITY): Payer: Self-pay | Admitting: *Deleted

## 2015-01-28 ENCOUNTER — Inpatient Hospital Stay (HOSPITAL_COMMUNITY): Payer: Medicaid Other

## 2015-01-28 ENCOUNTER — Encounter (HOSPITAL_COMMUNITY)
Admission: AD | Disposition: A | Discharge status: Home / Self Care | Payer: Self-pay | Source: Ambulatory Visit | Attending: Obstetrics and Gynecology

## 2015-01-28 DIAGNOSIS — O3421 Maternal care for scar from previous cesarean delivery: Secondary | ICD-10-CM | POA: Diagnosis present

## 2015-01-28 DIAGNOSIS — O9902 Anemia complicating childbirth: Secondary | ICD-10-CM | POA: Diagnosis present

## 2015-01-28 DIAGNOSIS — O368130 Decreased fetal movements, third trimester, not applicable or unspecified: Secondary | ICD-10-CM | POA: Diagnosis present

## 2015-01-28 DIAGNOSIS — D649 Anemia, unspecified: Secondary | ICD-10-CM | POA: Diagnosis present

## 2015-01-28 DIAGNOSIS — Z98891 History of uterine scar from previous surgery: Secondary | ICD-10-CM

## 2015-01-28 DIAGNOSIS — O4403 Placenta previa specified as without hemorrhage, third trimester: Secondary | ICD-10-CM | POA: Diagnosis present

## 2015-01-28 DIAGNOSIS — Z3A39 39 weeks gestation of pregnancy: Secondary | ICD-10-CM | POA: Diagnosis present

## 2015-01-28 LAB — CBC
HCT: 30.3 % — ABNORMAL LOW (ref 36.0–46.0)
HEMOGLOBIN: 10.7 g/dL — AB (ref 12.0–15.0)
MCH: 31.9 pg (ref 26.0–34.0)
MCHC: 35.3 g/dL (ref 30.0–36.0)
MCV: 90.4 fL (ref 78.0–100.0)
Platelets: 172 10*3/uL (ref 150–400)
RBC: 3.35 MIL/uL — AB (ref 3.87–5.11)
RDW: 12.5 % (ref 11.5–15.5)
WBC: 9.9 10*3/uL (ref 4.0–10.5)

## 2015-01-28 LAB — RPR: RPR Ser Ql: NONREACTIVE

## 2015-01-28 SURGERY — Surgical Case
Anesthesia: Spinal | Site: Abdomen

## 2015-01-28 MED ORDER — DIPHENHYDRAMINE HCL 50 MG/ML IJ SOLN
INTRAMUSCULAR | Status: AC
  Filled 2015-01-28: qty 1

## 2015-01-28 MED ORDER — OXYTOCIN 40 UNITS IN LACTATED RINGERS INFUSION - SIMPLE MED: 62.5000 mL/h | INTRAVENOUS | Status: AC

## 2015-01-28 MED ORDER — ONDANSETRON HCL 4 MG/2ML IJ SOLN: 4.0000 mg | Freq: Three times a day (TID) | INTRAMUSCULAR | Status: DC | PRN

## 2015-01-28 MED ORDER — IBUPROFEN 600 MG PO TABS
600.0000 mg | ORAL_TABLET | Freq: Four times a day (QID) | ORAL | Status: DC
  Administered 2015-01-28 – 2015-01-31 (×13): 600 mg via ORAL
  Filled 2015-01-28 (×13): qty 1

## 2015-01-28 MED ORDER — SIMETHICONE 80 MG PO CHEW
80.0000 mg | CHEWABLE_TABLET | ORAL | Status: DC
  Administered 2015-01-28 – 2015-01-31 (×3): 80 mg via ORAL
  Filled 2015-01-28 (×3): qty 1

## 2015-01-28 MED ORDER — DIPHENHYDRAMINE HCL 50 MG/ML IJ SOLN: 12.5000 mg | INTRAMUSCULAR | Status: DC | PRN

## 2015-01-28 MED ORDER — DIPHENHYDRAMINE HCL 50 MG/ML IJ SOLN
INTRAMUSCULAR | Status: DC | PRN
  Administered 2015-01-28: 50 mg via INTRAVENOUS

## 2015-01-28 MED ORDER — PHENYLEPHRINE 8 MG IN D5W 100 ML (0.08MG/ML) PREMIX OPTIME
INJECTION | INTRAVENOUS | Status: AC
Start: 2015-01-28 — End: 2015-01-28
  Filled 2015-01-28: qty 100

## 2015-01-28 MED ORDER — LACTATED RINGERS IV BOLUS (SEPSIS)
1000.0000 mL | Freq: Once | INTRAVENOUS | Status: AC
  Administered 2015-01-27: 1000 mL via INTRAVENOUS

## 2015-01-28 MED ORDER — NALOXONE HCL 1 MG/ML IJ SOLN
1.0000 ug/kg/h | INTRAMUSCULAR | Status: DC | PRN
  Filled 2015-01-28: qty 2

## 2015-01-28 MED ORDER — OXYCODONE-ACETAMINOPHEN 5-325 MG PO TABS
1.0000 | ORAL_TABLET | ORAL | Status: DC | PRN
  Administered 2015-01-28 – 2015-01-31 (×4): 1 via ORAL
  Filled 2015-01-28 (×4): qty 1

## 2015-01-28 MED ORDER — ONDANSETRON HCL 4 MG PO TABS: 4.0000 mg | ORAL_TABLET | ORAL | Status: DC | PRN

## 2015-01-28 MED ORDER — SCOPOLAMINE 1 MG/3DAYS TD PT72
1.0000 | MEDICATED_PATCH | Freq: Once | TRANSDERMAL | Status: AC
  Administered 2015-01-28: 1.5 mg via TRANSDERMAL

## 2015-01-28 MED ORDER — LANOLIN HYDROUS EX OINT: 1.0000 "application " | TOPICAL_OINTMENT | CUTANEOUS | Status: DC | PRN

## 2015-01-28 MED ORDER — NALBUPHINE HCL 10 MG/ML IJ SOLN: 5.0000 mg | INTRAMUSCULAR | Status: DC | PRN

## 2015-01-28 MED ORDER — MORPHINE SULFATE 0.5 MG/ML IJ SOLN
INTRAMUSCULAR | Status: AC
  Filled 2015-01-28: qty 10

## 2015-01-28 MED ORDER — HYDROMORPHONE HCL 1 MG/ML IJ SOLN
INTRAMUSCULAR | Status: AC
  Filled 2015-01-28: qty 1

## 2015-01-28 MED ORDER — OXYCODONE-ACETAMINOPHEN 5-325 MG PO TABS: 2.0000 | ORAL_TABLET | ORAL | Status: DC | PRN

## 2015-01-28 MED ORDER — HYDROMORPHONE HCL 1 MG/ML IJ SOLN
0.2500 mg | INTRAMUSCULAR | Status: DC | PRN
  Administered 2015-01-28: 0.5 mg via INTRAVENOUS

## 2015-01-28 MED ORDER — SIMETHICONE 80 MG PO CHEW
80.0000 mg | CHEWABLE_TABLET | Freq: Three times a day (TID) | ORAL | Status: DC
  Administered 2015-01-28 – 2015-01-31 (×10): 80 mg via ORAL
  Filled 2015-01-28 (×9): qty 1

## 2015-01-28 MED ORDER — FENTANYL CITRATE 0.05 MG/ML IJ SOLN
INTRAMUSCULAR | Status: DC | PRN
  Administered 2015-01-28: 25 ug via INTRATHECAL

## 2015-01-28 MED ORDER — ONDANSETRON HCL 4 MG/2ML IJ SOLN: 4.0000 mg | INTRAMUSCULAR | Status: DC | PRN

## 2015-01-28 MED ORDER — SCOPOLAMINE 1 MG/3DAYS TD PT72
MEDICATED_PATCH | TRANSDERMAL | Status: AC
  Administered 2015-01-28: 1.5 mg via TRANSDERMAL
  Filled 2015-01-28: qty 1

## 2015-01-28 MED ORDER — DIPHENHYDRAMINE HCL 25 MG PO CAPS
25.0000 mg | ORAL_CAPSULE | ORAL | Status: DC | PRN
  Administered 2015-01-28 – 2015-01-29 (×2): 25 mg via ORAL
  Filled 2015-01-28 (×2): qty 1

## 2015-01-28 MED ORDER — LACTATED RINGERS IV SOLN
INTRAVENOUS | Status: DC
  Administered 2015-01-28 (×3): via INTRAVENOUS

## 2015-01-28 MED ORDER — MEPERIDINE HCL 25 MG/ML IJ SOLN: 6.2500 mg | INTRAMUSCULAR | Status: DC | PRN

## 2015-01-28 MED ORDER — NALBUPHINE HCL 10 MG/ML IJ SOLN
5.0000 mg | Freq: Once | INTRAMUSCULAR | Status: AC | PRN
  Administered 2015-01-28: 5 mg via INTRAVENOUS

## 2015-01-28 MED ORDER — ONDANSETRON HCL 4 MG/2ML IJ SOLN
INTRAMUSCULAR | Status: DC | PRN
  Administered 2015-01-28: 4 mg via INTRAVENOUS

## 2015-01-28 MED ORDER — CITRIC ACID-SODIUM CITRATE 334-500 MG/5ML PO SOLN
30.0000 mL | Freq: Once | ORAL | Status: AC
  Administered 2015-01-28: 30 mL via ORAL
  Filled 2015-01-28: qty 15

## 2015-01-28 MED ORDER — ONDANSETRON HCL 4 MG/2ML IJ SOLN
INTRAMUSCULAR | Status: AC
  Filled 2015-01-28: qty 2

## 2015-01-28 MED ORDER — NALOXONE HCL 0.4 MG/ML IJ SOLN: 0.4000 mg | INTRAMUSCULAR | Status: DC | PRN

## 2015-01-28 MED ORDER — DIBUCAINE 1 % RE OINT: 1.0000 "application " | TOPICAL_OINTMENT | RECTAL | Status: DC | PRN

## 2015-01-28 MED ORDER — SENNOSIDES-DOCUSATE SODIUM 8.6-50 MG PO TABS
2.0000 | ORAL_TABLET | ORAL | Status: DC
  Administered 2015-01-28 – 2015-01-31 (×3): 2 via ORAL
  Filled 2015-01-28 (×3): qty 2

## 2015-01-28 MED ORDER — MORPHINE SULFATE (PF) 0.5 MG/ML IJ SOLN
INTRAMUSCULAR | Status: DC | PRN
  Administered 2015-01-28: .1 mg via INTRATHECAL

## 2015-01-28 MED ORDER — TETANUS-DIPHTH-ACELL PERTUSSIS 5-2.5-18.5 LF-MCG/0.5 IM SUSP: 0.5000 mL | Freq: Once | INTRAMUSCULAR | Status: DC

## 2015-01-28 MED ORDER — LACTATED RINGERS IV SOLN
INTRAVENOUS | Status: DC
Start: 2015-01-28 — End: 2015-01-31
  Administered 2015-01-28: 12:00:00 via INTRAVENOUS

## 2015-01-28 MED ORDER — MENTHOL 3 MG MT LOZG: 1.0000 | LOZENGE | OROMUCOSAL | Status: DC | PRN

## 2015-01-28 MED ORDER — BUPIVACAINE HCL (PF) 0.25 % IJ SOLN
INTRAMUSCULAR | Status: AC
  Filled 2015-01-28: qty 30

## 2015-01-28 MED ORDER — ZOLPIDEM TARTRATE 5 MG PO TABS: 5.0000 mg | ORAL_TABLET | Freq: Every evening | ORAL | Status: DC | PRN

## 2015-01-28 MED ORDER — 0.9 % SODIUM CHLORIDE (POUR BTL) OPTIME
TOPICAL | Status: DC | PRN
  Administered 2015-01-28: 1000 mL

## 2015-01-28 MED ORDER — NALBUPHINE HCL 10 MG/ML IJ SOLN
5.0000 mg | INTRAMUSCULAR | Status: DC | PRN
  Filled 2015-01-28: qty 1

## 2015-01-28 MED ORDER — PHENYLEPHRINE HCL 10 MG/ML IJ SOLN
INTRAMUSCULAR | Status: DC | PRN
  Administered 2015-01-28: 120 ug via INTRAVENOUS
  Administered 2015-01-28: 80 ug via INTRAVENOUS

## 2015-01-28 MED ORDER — OXYTOCIN 10 UNIT/ML IJ SOLN
INTRAMUSCULAR | Status: AC
  Filled 2015-01-28: qty 4

## 2015-01-28 MED ORDER — PHENYLEPHRINE 8 MG IN D5W 100 ML (0.08MG/ML) PREMIX OPTIME
INJECTION | INTRAVENOUS | Status: DC | PRN
  Administered 2015-01-28: 45 ug/min via INTRAVENOUS

## 2015-01-28 MED ORDER — WITCH HAZEL-GLYCERIN EX PADS: 1.0000 "application " | MEDICATED_PAD | CUTANEOUS | Status: DC | PRN

## 2015-01-28 MED ORDER — PRENATAL MULTIVITAMIN CH
1.0000 | ORAL_TABLET | Freq: Every day | ORAL | Status: DC
  Administered 2015-01-28 – 2015-01-31 (×4): 1 via ORAL
  Filled 2015-01-28 (×4): qty 1

## 2015-01-28 MED ORDER — SIMETHICONE 80 MG PO CHEW: 80.0000 mg | CHEWABLE_TABLET | ORAL | Status: DC | PRN

## 2015-01-28 MED ORDER — BUPIVACAINE IN DEXTROSE 0.75-8.25 % IT SOLN
INTRATHECAL | Status: DC | PRN
  Administered 2015-01-28: 1.8 mL via INTRATHECAL

## 2015-01-28 MED ORDER — DIPHENHYDRAMINE HCL 25 MG PO CAPS: 25.0000 mg | ORAL_CAPSULE | Freq: Four times a day (QID) | ORAL | Status: DC | PRN

## 2015-01-28 MED ORDER — SODIUM CHLORIDE 0.9 % IJ SOLN: 3.0000 mL | INTRAMUSCULAR | Status: DC | PRN

## 2015-01-28 MED ORDER — FENTANYL CITRATE 0.05 MG/ML IJ SOLN
INTRAMUSCULAR | Status: AC
  Filled 2015-01-28: qty 2

## 2015-01-28 MED ORDER — NALBUPHINE HCL 10 MG/ML IJ SOLN: 5.0000 mg | Freq: Once | INTRAMUSCULAR | Status: AC | PRN

## 2015-01-28 MED ORDER — OXYTOCIN 10 UNIT/ML IJ SOLN
40.0000 [IU] | INTRAVENOUS | Status: DC | PRN
  Administered 2015-01-28: 40 [IU] via INTRAVENOUS

## 2015-01-28 SURGICAL SUPPLY — 32 items
BENZOIN TINCTURE PRP APPL 2/3 (GAUZE/BANDAGES/DRESSINGS) ×3 IMPLANT
CLAMP CORD UMBIL (MISCELLANEOUS) ×3 IMPLANT
CLOSURE WOUND 1/2 X4 (GAUZE/BANDAGES/DRESSINGS) ×1
CLOTH BEACON ORANGE TIMEOUT ST (SAFETY) ×3 IMPLANT
CONTAINER PREFILL 10% NBF 15ML (MISCELLANEOUS) IMPLANT
DRAPE SHEET LG 3/4 BI-LAMINATE (DRAPES) ×3 IMPLANT
DRSG OPSITE POSTOP 4X10 (GAUZE/BANDAGES/DRESSINGS) ×3 IMPLANT
DURAPREP 26ML APPLICATOR (WOUND CARE) ×3 IMPLANT
ELECT REM PT RETURN 9FT ADLT (ELECTROSURGICAL) ×3
ELECTRODE REM PT RTRN 9FT ADLT (ELECTROSURGICAL) ×1 IMPLANT
EXTRACTOR VACUUM M CUP 4 TUBE (SUCTIONS) IMPLANT
EXTRACTOR VACUUM M CUP 4' TUBE (SUCTIONS)
GLOVE BIO SURGEON STRL SZ7.5 (GLOVE) ×3 IMPLANT
GLOVE BIOGEL PI IND STRL 7.5 (GLOVE) ×1 IMPLANT
GLOVE BIOGEL PI INDICATOR 7.5 (GLOVE) ×2
GOWN STRL REUS W/TWL LRG LVL3 (GOWN DISPOSABLE) ×6 IMPLANT
KIT ABG SYR 3ML LUER SLIP (SYRINGE) IMPLANT
NEEDLE HYPO 25X5/8 SAFETYGLIDE (NEEDLE) IMPLANT
NS IRRIG 1000ML POUR BTL (IV SOLUTION) ×3 IMPLANT
PACK C SECTION WH (CUSTOM PROCEDURE TRAY) ×3 IMPLANT
PAD OB MATERNITY 4.3X12.25 (PERSONAL CARE ITEMS) ×3 IMPLANT
RTRCTR C-SECT PINK 25CM LRG (MISCELLANEOUS) ×3 IMPLANT
STRIP CLOSURE SKIN 1/2X4 (GAUZE/BANDAGES/DRESSINGS) ×2 IMPLANT
SUT CHROMIC 2 0 CT 1 (SUTURE) ×3 IMPLANT
SUT MNCRL AB 3-0 PS2 27 (SUTURE) ×3 IMPLANT
SUT PLAIN 0 NONE (SUTURE) IMPLANT
SUT PLAIN 2 0 XLH (SUTURE) ×3 IMPLANT
SUT VIC AB 0 CT1 36 (SUTURE) ×3 IMPLANT
SUT VIC AB 0 CTX 36 (SUTURE) ×6
SUT VIC AB 0 CTX36XBRD ANBCTRL (SUTURE) ×3 IMPLANT
TOWEL OR 17X24 6PK STRL BLUE (TOWEL DISPOSABLE) ×3 IMPLANT
TRAY FOLEY CATH 14FR (SET/KITS/TRAYS/PACK) ×3 IMPLANT

## 2015-01-28 NOTE — Progress Notes (Signed)
RN in room and discussed breastfeeding with pt. Pt stating infant only latching on rt. Breast, not left. Hand pump given to help with inverted short shaft nipples.  Pr requesting bottle formula for infant. Benefits of breastfeeding given to pt and vs bottle formula. Pt still wants to supplement

## 2015-01-28 NOTE — Transfer of Care (Signed)
Immediate Anesthesia Transfer of Care Note  Patient: Carla CavesLatoya Barlowe  Procedure(s) Performed: Procedure(s): CESAREAN SECTION (N/A)  Patient Location: PACU  Anesthesia Type:Spinal  Level of Consciousness: awake, alert , oriented and patient cooperative  Airway & Oxygen Therapy: Patient Spontanous Breathing  Post-op Assessment: Report given to RN and Post -op Vital signs reviewed and stable  Post vital signs: Reviewed and stable  Last Vitals:  Filed Vitals:   01/28/15 0027  BP: 114/78  Pulse: 85  Temp:   Resp: 16    Complications: No apparent anesthesia complications

## 2015-01-28 NOTE — MAU Note (Signed)
IN  OR  AFTER SPINAL-     FHR  AT [id number]-   AUDIBLE   ACCEL  ;    THEN  AT   0120-  FHR -  150

## 2015-01-28 NOTE — Lactation Note (Signed)
This note was copied from the chart of Carla Rodgers. Lactation Consultation Note  Initial visit made.  Breastfeeding consultation services and support information given to patient.  Reviewed basic teaching but mom is very sleepy.  Baby is skin to skin on mom's chest after bath.  Baby showing some early feeding cues.  Assisted with positioning baby in football hold on right side.  Colostrum easily expressed and baby latches on easily and deep.  Baby nursed actively with audible swallows.  Mom asking if she can use formula if breastfeeding becomes overwhelming.  Encouraged mom to ask for assist when needed and we will support her needs and goals.  Patient Name: Carla Brayton CavesLatoya Ledwith [id number]'s Date: 01/28/2015 Reason for consult: Initial assessment   Maternal Data Has patient been taught Hand Expression?: Yes Does the patient have breastfeeding experience prior to this delivery?: Yes  Feeding Feeding Type: Breast Fed Length of feed: 35 min  LATCH Score/Interventions Latch: Grasps breast easily, tongue down, lips flanged, rhythmical sucking. Intervention(s): Adjust position;Assist with latch;Breast massage;Breast compression  Audible Swallowing: Spontaneous and intermittent  Type of Nipple: Inverted Intervention(s): Reverse pressure  Comfort (Breast/Nipple): Soft / non-tender  Problem noted: Mild/Moderate discomfort (during latch; infant latched correctly)  Hold (Positioning): Assistance needed to correctly position infant at breast and maintain latch. Intervention(s): Breastfeeding basics reviewed;Support Pillows;Position options;Skin to skin  LATCH Score: 7  Lactation Tools Discussed/Used     Consult Status Consult Status: Follow-up Date: 01/29/15 Follow-up type: In-patient    Huston FoleyMOULDEN, Sofia Jaquith S 01/28/2015, 10:19 AM

## 2015-01-28 NOTE — Progress Notes (Signed)
Carla CavesLatoya [place] [id number] Postpartum Day 0 9 Hours Post-Op S/P Repeat Cesarean Section due to presenting in active labor  Subjective: Patient up ad lib, denies syncope or dizziness. Reports consuming regular diet without issues and denies N/V. Patient reports negative bowel movement and is not passing flatus. Pain is being appropriately managed with use of percocet and ibuprofen.   Objective: Temp:  [97.2 F (36.2 C)-99.2 F (37.3 C)] 97.9 F (36.6 C) (02/18 0641) Pulse Rate:  [64-100] 73 (02/18 0641) Resp:  [11-21] 18 (02/18 0641) BP: (85-140)/(51-101) 98/64 mmHg (02/18 0641) SpO2:  [97 %-100 %] 98 % (02/18 0641) Weight:  [185 lb 6.4 oz (84.097 kg)-189 lb (85.73 kg)] 185 lb 6.4 oz (84.097 kg) (02/17 1136)   Recent Labs  01/27/15 1115 01/28/15 0640  HGB 11.2* 10.7*  HCT 32.7* 30.3*  WBC 5.7 9.9    Physical Exam:  General: alert, cooperative and no distress Mood/Affect: Appropriate/Bright Lungs: clear to auscultation, no wheezes, rales or rhonchi, symmetric air entry.  Heart: normal rate and regular rhythm. Abdomen:  No bowel sounds, Soft, Appropriately tender Incision: no significant drainage noted on Honeycomb dressing  Uterine Fundus: firm at umbilicus Lochia: appropriate Skin: Warm, Dry. DVT Evaluation: No evidence of DVT seen on physical exam. No cords or calf tenderness. JP drain:   None  Assessment Post Operative Day 0 S/P Repeat C/S Normal Involution Hemodynamically Stable  Plan: -Out of bed by days end -Continue other mgmt as ordered -Dr. Audree CamelN. Dillard to be updated on patient status  Marlene BastMLY, Taiya Nutting LYNN MSN, CNM 01/28/2015, 9:33 AM

## 2015-01-28 NOTE — Anesthesia Postprocedure Evaluation (Signed)
  Anesthesia Post-op Note  Patient: Carla Rodgers  Procedure(s) Performed: Procedure(s): CESAREAN SECTION (N/A)  Patient is awake, responsive, moving her legs, and has signs of resolution of her numbness. Pain and nausea are reasonably well controlled. Vital signs are stable and clinically acceptable. Oxygen saturation is clinically acceptable. There are no apparent anesthetic complications at this time. Patient is ready for discharge.

## 2015-01-28 NOTE — Anesthesia Preprocedure Evaluation (Addendum)
Anesthesia Evaluation  Patient identified by MRN, date of birth, ID band Patient awake    Reviewed: Allergy & Precautions, H&P , Patient's Chart, lab work & pertinent test results  Airway Mallampati: II  TM Distance: >3 FB Neck ROM: full    Dental no notable dental hx.    Pulmonary  breath sounds clear to auscultation  Pulmonary exam normal       Cardiovascular Exercise Tolerance: Good Rhythm:regular Rate:Normal     Neuro/Psych    GI/Hepatic   Endo/Other    Renal/GU      Musculoskeletal   Abdominal   Peds  Hematology   Anesthesia Other Findings Possibility of Accreta noted T&S Ab Neg Good airway  Reproductive/Obstetrics                            Anesthesia Physical Anesthesia Plan  ASA: II and emergent  Anesthesia Plan: Spinal   Post-op Pain Management:    Induction:   Airway Management Planned:   Additional Equipment:   Intra-op Plan:   Post-operative Plan:   Informed Consent: I have reviewed the patients History and Physical, chart, labs and discussed the procedure including the risks, benefits and alternatives for the proposed anesthesia with the patient or authorized representative who has indicated his/her understanding and acceptance.   Dental Advisory Given  Plan Discussed with: CRNA  Anesthesia Plan Comments: (Lab work confirmed with CRNA in room. Platelets okay. Discussed spinal anesthetic, and patient consents to the procedure:  included risk of possible headache,backache, failed block, allergic reaction, and nerve injury. This patient was asked if she had any questions or concerns before the procedure started. )        Anesthesia Quick Evaluation

## 2015-01-28 NOTE — Anesthesia Postprocedure Evaluation (Signed)
Anesthesia Post Note  Patient: Carla Rodgers  Procedure(s) Performed: Procedure(s) (LRB): CESAREAN SECTION (N/A)  Anesthesia type: Spinal  Patient location: Mother/Baby  Post pain: Pain level controlled  Post assessment: Post-op Vital signs reviewed  Last Vitals:  Filed Vitals:   01/28/15 0641  BP: 98/64  Pulse: 73  Temp: 36.6 C  Resp: 18    Post vital signs: Reviewed  Level of consciousness: awake  Complications: No apparent anesthesia complications

## 2015-01-28 NOTE — MAU Note (Signed)
IN OR 

## 2015-01-28 NOTE — Addendum Note (Signed)
Addendum  created 01/28/15 1048 by Graciela HusbandsWynn O Amica Harron, CRNA   Modules edited: Notes Section   Notes Section:  File: [id number]

## 2015-01-28 NOTE — Op Note (Signed)
Cesarean Section Procedure Note  Indications: Pt presents in labor with a h/o c-section and scheduled for c-section on the 19th.  Pre-operative Diagnosis: Labor, Previous Cesarean Section   Post-operative Diagnosis: Labor, Previous Cesarean Section  Procedure: REPEAT CESAREAN SECTION  Surgeon: Purcell NailsAngela Y Shawneequa Baldridge, MD    Assistants: Sherre ScarletKimberly Williams, CNM  Anesthesia: Spinal  Anesthesiologist: Cristela BlueKyle Jackson, MD   Procedure Details  The patient was taken to the operating room secondary to presenting in labor and repeat c-section after the risks, benefits, complications, treatment options, and expected outcomes were discussed with the patient.  The patient concurred with the proposed plan, giving informed consent which was signed and witnessed. The patient was taken to Operating Room one, identified as Carla Rodgers and the procedure verified as C-Section Delivery. A Time Out was held and the above information confirmed.  After induction of anesthesia by obtaining a spinal, the patient was prepped and draped in the usual sterile manner. A Pfannenstiel skin incision was made and carried down through the subcutaneous tissue to the underlying layer of fascia.  The fascia was incised bilaterally and extended transversely bilaterally with the Mayo scissors. Kocher clamps were placed on the inferior aspect of the fascial incision and the underlying rectus muscle was separated from the fascia. The same was done on the superior aspect of the fascial incision.  The peritoneum was identified, entered bluntly and extended manually.  An Alexis self-retaining retractor was placed.  The utero-vesical peritoneal reflection was incised transversely and the bladder flap was bluntly freed from the lower uterine segment. A low transverse uterine incision was made with the scalpel and extended bilaterally with the bandage scissors.  The infant was delivered in vertex position without difficulty.  After the umbilical  cord was clamped and cut, the infant was handed to the awaiting pediatricians.  Cord blood was obtained for evaluation.  The placenta was removed intact and appeared to be within normal limits. The uterus was cleared of all clots and debris. The uterine incision was closed with running interlocking sutures of 0 Vicryl and a second imbricating layer was performed as well.   Bilateral tubes and ovaries appeared to be within normal limits.  Good hemostasis was noted.  Copious irrigation was performed until clear.  The peritoneum was repaired with 2-0 chromic via a running suture.  The fascia was reapproximated with a running suture of 0 Vicryl.  The skin was reapproximated with a subcuticular suture of 3-0 monocryl.  Steristrips were applied.  Instrument, sponge, and needle counts were correct prior to abdominal closure and at the conclusion of the case.  The patient was awaiting transfer to the recovery room in good condition.  Findings: Live female infant with Apgars 8 at one minute and 9 at five minutes.  Normal appearing bilateral ovaries and fallopian tubes were noted.  Estimated Blood Loss:  700 ml         Drains: foley to gravity 300 cc         Total IV Fluids: 1500 ml         Specimens to Pathology: Placenta         Complications:  None; patient tolerated the procedure well.         Disposition: PACU - hemodynamically stable.         Condition: stable  Attending Attestation: I performed the procedure.

## 2015-01-28 NOTE — Anesthesia Procedure Notes (Signed)
Spinal Patient location during procedure: OR Preanesthetic Checklist Completed: patient identified, site marked, surgical consent, pre-op evaluation, timeout performed, IV checked, risks and benefits discussed and monitors and equipment checked Spinal Block Patient position: sitting Prep: DuraPrep Patient monitoring: heart rate, cardiac monitor, continuous pulse ox and blood pressure Approach: midline Location: L3-4 Injection technique: single-shot Needle Needle type: Sprotte  Needle gauge: 24 G Needle length: 9 cm Assessment Sensory level: T4 Additional Notes Spinal Dosage in OR  Bupivicaine ml       1.8 PFMS04   mcg        100 Fentanyl mcg            25    

## 2015-01-29 ENCOUNTER — Encounter (HOSPITAL_COMMUNITY): Admission: RE | Payer: Self-pay | Source: Ambulatory Visit

## 2015-01-29 ENCOUNTER — Inpatient Hospital Stay (HOSPITAL_COMMUNITY)
Admission: RE | Admit: 2015-01-29 | Payer: Medicaid Other | Source: Ambulatory Visit | Admitting: Obstetrics and Gynecology

## 2015-01-29 ENCOUNTER — Encounter (HOSPITAL_COMMUNITY): Payer: Self-pay | Admitting: Obstetrics and Gynecology

## 2015-01-29 SURGERY — Surgical Case
Anesthesia: Regional

## 2015-01-29 NOTE — Lactation Note (Signed)
This note was copied from the chart of Carla Payden Tauer. Lactation Consultation Note  Follow up visit made.  Mom states baby is latching and nursing well.  C/o sore nipples.  Her nipples are inverted and dimpled and when nipples are pulled out area of cracked skin breakdown noted.  I explained to patient that it is possible adhesions may be breaking down as baby nurses .  Comfort gels given with instructions.  Encouraged mom to continue breastfeeding baby and nipples should begin to improve in the next week or two.  Mom is asking about nipple cream.  Suggested she speak to her midwife about an Rx for All Purpose Nipple Ointment she can get [facility].  Also recommended using small amounts of coconut oil after feeds.  Mom agreeable to plan.  Patient Name: Carla Brayton CavesLatoya Etherington WUJWJ'XToday's Date: 01/29/2015     Maternal Data    Feeding Feeding Type: Breast Fed Length of feed: 10 min  LATCH Score/Interventions                      Lactation Tools Discussed/Used     Consult Status      Huston FoleyMOULDEN, Royden Bulman S 01/29/2015, 2:36 PM

## 2015-01-29 NOTE — Progress Notes (Signed)
Carla CavesLatoya Rodgers [id number] Postpartum Day 1 S/P Repeat Cesarean Section due to presentation in active labor and desire for repeat  Subjective: Patient up ad lib, denies syncope or dizziness. Reports consuming regular diet without issues and denies N/V. Patient reports no bowel movement an is not passing flatus.  Denies issues with urination and reports bleeding is "good."  Patient is breastfeeding and reports issues that require supplementation.  Desires NFP for postpartum contraception.  Pain is being appropriately managed with use of ibuprofen.   Objective: Temp:  [97.8 F (36.6 C)-98.7 F (37.1 C)] 98.7 F (37.1 C) (02/19 0600) Pulse Rate:  [68-74] 69 (02/19 0600) Resp:  [16-20] 16 (02/19 0600) BP: (86-108)/(51-72) 103/58 mmHg (02/19 0600) SpO2:  [99 %-100 %] 100 % (02/19 0130)   Recent Labs  01/27/15 1115 01/28/15 0640  HGB 11.2* 10.7*  HCT 32.7* 30.3*  WBC 5.7 9.9    Physical Exam:  General: alert, cooperative and no distress Mood/Affect: Appropriate/Bright Lungs: clear to auscultation, no wheezes, rales or rhonchi, symmetric air entry.  Heart: normal rate and regular rhythm. Abdomen:  + bowel sounds in BUQ, Appropriately tender Incision: no significant drainage on Honeycomb dressing  Uterine Fundus: firm at U/-1 Lochia: appropriate Skin: Warm, Dry. DVT Evaluation: No evidence of DVT seen on physical exam. No significant calf/ankle edema. JP drain:   None  Assessment Post Operative Day 1 S/P Repeat C/S Normal Involution BreastFeeding/Supplementing Hemodynamically Stable  Plan: -Lactation Consult -Continue other mgmt as ordered -Dr. Lance MorinA. Roberts to be updated on patient status  Carla Rodgers, Carla Rodgers LYNN MSN, CNM 01/29/2015, 8:47 AM

## 2015-01-30 NOTE — Lactation Note (Addendum)
This note was copied from the chart of Carla Rodgers. Lactation Consultation Note  Patient Name: Carla Brayton CavesLatoya Dobbin ZOXWR'UToday's Date: 01/30/2015 Reason for consult: Follow-up assessment    Mom with now evetered nipples, red and raw and painful, right more than left. She has been hand pumping and bottle feeding, plus some breast feeding. I assisted mom with obtaining a deeper latch, and she felt more comfortable. Her breast are beginning to get over full/engorged. I got ice for mom and instructed her to ice on and off, and set up a DEP and mom expressed over 60 mls of milk, felt much softer. I advised mom to pump to comfort. With next feeding, I advised mom to latch baby first, and offer EBM after. Mom not sure if she wants to pump and bottle or breast feed, due to her sore nipples. I told her she had time to decide, but to see if latching is more comfortable with a deeper latch and proper positioning.I also gave mom new comfort gels, and instructed her in their use(keeping them as clean as possible). Mom will call for questions/concerns.    Maternal Data    Feeding Feeding Type: Breast Fed  LATCH Score/Interventions Latch: Grasps breast easily, tongue down, lips flanged, rhythmical sucking.  Audible Swallowing: A few with stimulation  Type of Nipple: Everted at rest and after stimulation (were inverted, now everted but raw and tender) Intervention(s): Double electric pump;Hand pump  Comfort (Breast/Nipple): Engorged, cracked, bleeding, large blisters, severe discomfort Problem noted: Cracked, bleeding, blisters, bruises Intervention(s): Double electric pump;Expressed breast milk to nipple  Problem noted: Severe discomfort Interventions (Mild/moderate discomfort): Comfort gels Interventions (Severe discomfort): Double electric pum;Flange size;Observe pumping  Hold (Positioning): Assistance needed to correctly position infant at breast and maintain latch. Intervention(s):  Breastfeeding basics reviewed;Support Pillows;Position options;Skin to skin  LATCH Score: 6  Lactation Tools Discussed/Used WIC Program: Yes Pump Review: Setup, frequency, and cleaning;Milk Storage;Other (comment) Initiated by:: clee rn lc Date initiated:: 01/30/15   Consult Status Consult Status: Follow-up Date: 01/31/15 Follow-up type: In-patient    Alfred LevinsLee, Adil Tugwell Anne 01/30/2015, 9:21 AM

## 2015-01-30 NOTE — Progress Notes (Signed)
Carla CavesLatoya Rodgers [id number] Postpartum Day 2 S/P Repeat Cesarean Section due to presentation in active labor and desire for repeat  Subjective: Patient up ad lib, denies syncope or dizziness. Reports consuming regular diet without issues and denies N/V. Patient reports negative bowel movement and is passing flatus.  Denies issues with urination and reports bleeding is "the same."  Patient is breastfeeding and is having issues with latch.  Desires NFP for postpartum contraception.  Pain is being appropriately managed with use of ibuprofen throughout the day and percocet at night.   Objective: Temp:  [97.9 F (36.6 C)-98.1 F (36.7 C)] 97.9 F (36.6 C) (02/20 0555) Pulse Rate:  [64-75] 75 (02/20 0555) Resp:  [18] 18 (02/20 0555) BP: (103-108)/(52-60) 108/60 mmHg (02/20 0555)   Recent Labs  01/27/15 1115 01/28/15 0640  HGB 11.2* 10.7*  HCT 32.7* 30.3*  WBC 5.7 9.9    Physical Exam:  General: alert, cooperative and no distress Mood/Affect: Appropriate Lungs: clear to auscultation, no wheezes, rales or rhonchi, symmetric air entry.  Heart: normal rate and regular rhythm. Abdomen:  + bowel sounds, Appropriately Tender Incision: no significant drainage noted on Honeycomb dressing  Uterine Fundus: firm, U/-1 Lochia: appropriate Skin: Warm, Dry. DVT Evaluation: No evidence of DVT seen on physical exam. No significant calf/ankle edema. JP drain:   None  Assessment Post Operative Day 2 S/P Repeat C/S Normal Involution Breast Feeding Hemodynamically Stable  Plan: -Lactation Consult -Plan for discharge tomorrow -Continue other mgmt as ordered -Dr. Lance MorinA. Roberts to be updated on patient status  Marlene BastMLY, Tomoya Ringwald LYNN MSN, CNM 01/30/2015, 9:16 AM

## 2015-01-31 MED ORDER — IBUPROFEN 600 MG PO TABS: 600.0000 mg | ORAL_TABLET | Freq: Four times a day (QID) | ORAL | Status: AC

## 2015-01-31 MED ORDER — DOCUSATE SODIUM 100 MG PO CAPS: 100.0000 mg | ORAL_CAPSULE | Freq: Two times a day (BID) | ORAL | Status: AC | PRN

## 2015-01-31 MED ORDER — OXYCODONE-ACETAMINOPHEN 5-325 MG PO TABS: 1.0000 | ORAL_TABLET | ORAL | Status: AC | PRN
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
## 2015-01-31 NOTE — Lactation Note (Signed)
This note was copied from the chart of Girl Kensie Deringer. Lactation Consultation Note  Patient Name: Girl Brayton CavesLatoya Assad RUEAV'WToday's Date: 01/31/2015 Reason for consult: Follow-up assessment     Mom and baby now 81 hours post partum. Mom has an abundant supply of milk. Slightly engorged, mostly full, then soft after pumping. 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 I spoke to Dr.chandler and asked that she speak to mom about this. Mom said she may just pump and bottle feed. She is overwhelmed at this point. I told her to do what is best for her. I also reviewed breast care, sing and symptome of mastitis, gave her new comfort gels, and advised mom to call for an questions/coocerns or o/p consult, to lactation. Wic DEP loaned to mom.    Maternal Data    Feeding Feeding Type: Breast Fed  LATCH Score/Interventions Latch: Repeated attempts needed to sustain latch, nipple held in mouth throughout feeding, stimulation needed to elicit sucking reflex. (baby fussy - used to blttle , tried a few sucks on botle and then baby latched to breast)  Audible Swallowing: A few with stimulation  Type of Nipple: Everted at rest and after stimulation  Comfort (Breast/Nipple): Engorged, cracked, bleeding, large blisters, severe discomfort Problem noted: Engorgment;Cracked, bleeding, blisters, bruises Intervention(s): Ice Intervention(s): Double electric pump  Problem noted: Severe discomfort (baby with possible tt and mom iwth split nipple - very painful) Interventions  (Cracked/bleeding/bruising/blister): Expressed breast milk to nipple;Double electric pump Interventions (Mild/moderate discomfort): Comfort gels        Lactation Tools Discussed/Used [facility]IC Program: Yes [facility] Pump Review: Setup, frequency, and cleaning   Consult Status Consult Status: Complete Follow-up type: Call as needed    Alfred LevinsLee, Sherryann Frese  Anne 01/31/2015, 10:49 AM

## 2015-01-31 NOTE — Discharge Summary (Signed)
Cesarean Section Delivery Discharge Summary  Carla Rodgers  DOB:    07/07/1981 MRN:    [id number] CSN:    [id number]  Date of admission:                  01/27/15  Date of discharge:                   01/31/15  Procedures this admission:  Repeat CS  Date of Delivery: 01/28/15  Newborn Data:  Live born  Information for the patient's newborn:  Alphonsa OverallStapleton, Girl Brya [[id number]][[id number]]  female    Live born female  Birth Weight: 6 lb 8.1 oz (2950 g) APGAR: 8, 9    Home with mother. Name: Loyce DysLa'che'   History of Present Illness:  Carla Rodgers is a 34 y.o. female, [phone] [id number], who presents at 7965w0d weeks gestation. [facility].    Her pregnancy has been complicated by:  Patient Active Problem List   Diagnosis Date Noted  . S/P repeat low transverse C-section 01/28/2015  . Previous cesarean delivery, antepartum condition or complication 01/27/2015  . Decreased fetal movement 01/27/2015  . Low lying placenta without hemorrhage, antepartum--no evidence of accreta on MRI 11/20/14--resolved to anterior placenta on last US 10/13/2014  . 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 Social problem 10/13/2014    Hospital course: The patient was admitted for active labor.   Her postpartum course was complicated by painful split nipples.  She was discharged to home on postpartum day 3 doing well.  LC working with her to help with breastfeeding/pumping  Feeding: breast  Contraception: natural family planning (NFP) .    Discharge hemoglobin: HEMOGLOBIN  Date Value Ref Range Status  01/28/2015 10.7* 12.0 - 15.0 g/dL Final   HCT  Date Value Ref Range Status  01/28/2015 30.3* 36.0 - 46.0 % Final   Anemia - hemodynamicly stable.    PreNatal Labs ABO, Rh: --/--/O POS, O POS (02/17 1115)   Antibody: NEG (02/17 1115) Rubella:    immune RPR: Non Reactive (02/17 1115)  HBsAg: Negative (10/01 0000)  HIV: Non-reactive (10/01 0000)  GBS:  negative  Discharge Physical Exam:  General: alert and cooperative Lochia: appropriate Uterine Fundus: firm Incision: healing well DVT Evaluation: No evidence of DVT seen on physical exam.  Intrapartum Procedures: cesarean: low cervical, transverse Postpartum Procedures: none Complications-Operative and Postpartum: none  Discharge Diagnoses: Term Pregnancy-delivered, asymptomatic anemia  Discharge Information:  Activity:           pelvic rest Diet:                routine Medications: PNV, Ibuprofen, Colace and Percocet Condition:      stable   Newborn Data: Live born  Information for the patient's newborn:  Alphonsa OverallStapleton, Girl Gizel [[id number]][[id number]]  female APGAR8/9 , ; weight  ;6lb 8.1oz  Home with mother.  Discharge to: home  Follow-up Information    [facility]. Schedule an appointment as soon as possible for a visit in 2 weeks.   Specialty:  Obstetrics and Gynecology   Why:  For wound re-check   Contact information:   [address] [place] [phone]      [facility]. Schedule an appointment as soon as possible for a visit in 6 weeks.  Specialty:  Obstetrics and Gynecology   Why:  Postpartum check up   Contact information:   [address] [place] [address] [phone]     Pt reports she may be moving out of town prior to the 6 week visit.    Yehuda Printup, CNM, MSN  01/31/2015. 8:54 AM  Care After Cesarean Delivery  Refer to this sheet in the next few weeks. These instructions provide you with information on caring for yourself after your procedure. Your caregiver may also give you specific instructions. Your treatment has been planned according to current medical practices, but  problems sometimes occur. Call your caregiver if you have any problems or questions after you go home. HOME CARE INSTRUCTIONS  Only take over-the-counter or prescription medicines as directed by your caregiver.  Do not drink alcohol, especially if you are breastfeeding or taking medicine to relieve pain.  Do not chew or smoke tobacco.  Continue to use good perineal care. Good perineal care includes:  Wiping your perineum from front to back.  Keeping your perineum clean.  Check your cut (incision) daily for increased redness, drainage, swelling, or separation of skin.  Clean your incision gently with soap and water every day, and then pat it dry. If your caregiver says it is okay, leave the incision uncovered. Use a bandage (dressing) if the incision is draining fluid or appears irritated. If the adhesive strips across the incision do not fall off within 7 days, carefully peel them off.  Hug a pillow when coughing or sneezing until your incision is healed. This helps to relieve pain.  Do not use tampons or douche until your caregiver says it is okay.  Shower, wash your hair, and take tub baths as directed by your caregiver.  Wear a well-fitting bra that provides breast support.  Limit wearing support panties or control-top hose.  Drink enough fluids to keep your urine clear or pale yellow.  Eat high-fiber foods such as whole grain cereals and breads, brown rice, beans, and fresh fruits and vegetables every day. These foods may help prevent or relieve constipation.  Resume activities such as climbing stairs, driving, lifting, exercising, or traveling as directed by your caregiver.  Talk to your caregiver about resuming sexual activities. This is dependent upon your risk of infection, your rate of healing, and your comfort and desire to resume sexual activity.  Try to have someone help you with your household activities and your newborn for at least a few days after you leave the  hospital.  Rest as much as possible. Try to rest or take a nap when your newborn is sleeping.  Increase your activities gradually.  Keep all of your scheduled postpartum appointments. It is very important to keep your scheduled follow-up appointments. At these appointments, your caregiver will be checking to make sure that you are healing physically and emotionally. SEEK MEDICAL CARE IF:   You are passing large clots from your vagina. Save any clots to show your caregiver.  You have a foul smelling discharge from your vagina.  You have trouble urinating.  You are urinating frequently.  You have pain when you urinate.  You have a change in your bowel movements.  You have increasing redness, pain, or swelling near your incision.  You have pus draining from your incision.  Your incision is separating.  You have painful, hard, or reddened breasts.  You have a severe headache.  You have blurred vision or see spots.  You feel  sad or depressed.  You have thoughts of hurting yourself or your newborn.  You have questions about your care, the care of your newborn, or medicines.  You are dizzy or lightheaded.  You have a rash.  You have pain, redness, or swelling at the site of the removed intravenous access (IV) tube.  You have nausea or vomiting.  You stopped breastfeeding and have not had a menstrual period within 12 weeks of stopping.  You are not breastfeeding and have not had a menstrual period within 12 weeks of delivery.  You have a fever. SEEK IMMEDIATE MEDICAL CARE IF:  You have persistent pain.  You have chest pain.  You have shortness of breath.  You faint.  You have leg pain.  You have stomach pain.  Your vaginal bleeding saturates 2 or more sanitary pads in 1 hour. MAKE SURE YOU:   Understand these instructions.  Will watch your condition.  Will get help right away if you are not doing well or get worse. Document Released: 08/19/2002  Document Revised: 08/21/2012 Document Reviewed: 07/24/2012 [facility] Patient Information 2014 [place].   Postpartum Depression and Baby Blues  The postpartum period begins right after the birth of a baby. During this time, there is often a great amount of joy and excitement. It is also a time of considerable changes in the life of the parent(s). Regardless of how many times a mother gives birth, each child brings new challenges and dynamics to the family. It is not unusual to have feelings of excitement accompanied by confusing shifts in moods, emotions, and thoughts. All mothers are at risk of developing postpartum depression or the "baby blues." These mood changes can occur right after giving birth, or they may occur many months after giving birth. The baby blues or postpartum depression can be mild or severe. Additionally, postpartum depression can resolve rather quickly, or it can be a long-term condition. CAUSES Elevated hormones and their rapid decline are thought to be a main cause of postpartum depression and the baby blues. There are a number of hormones that radically change during and after pregnancy. Estrogen and progesterone usually decrease immediately after delivering your baby. The level of thyroid hormone and various cortisol steroids also rapidly drop. Other factors that play a major role in these changes include major life events and genetics.  RISK FACTORS If you have any of the following risks for the baby blues or postpartum depression, know what symptoms to watch out for during the postpartum period. Risk factors that may increase the likelihood of getting the baby blues or postpartum depression include: 1. Havinga personal or family history of depression. 2. Having depression while being pregnant. 3. Having premenstrual or oral contraceptive-associated mood issues. 4. Having exceptional life stress. 5. Having marital conflict. 6. Lacking a social support  network. 7. Having a baby with special needs. 8. Having health problems such as diabetes. SYMPTOMS Baby blues symptoms include:  Brief fluctuations in mood, such as going from extreme happiness to sadness.  Decreased concentration.  Difficulty sleeping.  Crying spells, tearfulness.  Irritability.  Anxiety. Postpartum depression symptoms typically begin within the first month after giving birth. These symptoms include:  Difficulty sleeping or excessive sleepiness.  Marked weight loss.  Agitation.  Feelings of worthlessness.  Lack of interest in activity or food. Postpartum psychosis is a very concerning condition and can be dangerous. Fortunately, it is rare. Displaying any of the following symptoms is cause for immediate medical attention. Postpartum psychosis symptoms include:  Hallucinations and delusions.  Bizarre or disorganized behavior.  Confusion or disorientation. DIAGNOSIS  A diagnosis is made by an evaluation of your symptoms. There are no medical or lab tests that lead to a diagnosis, but there are various questionnaires that a caregiver may use to identify those with the baby blues, postpartum depression, or psychosis. Often times, a screening tool called the [place] Postnatal Depression Scale is used to diagnose depression in the postpartum period.  TREATMENT The baby blues usually goes away on its own in 1 to 2 weeks. Social support is often all that is needed. You should be encouraged to get adequate sleep and rest. Occasionally, you may be given medicines to help you sleep.  Postpartum depression requires treatment as it can last several months or longer if it is not treated. Treatment may include individual or group therapy, medicine, or both to address any social, physiological, and psychological factors that may play a role in the depression. Regular exercise, a healthy diet, rest, and social support may also be strongly recommended.  Postpartum psychosis  is more serious and needs treatment right away. Hospitalization is often needed. HOME CARE INSTRUCTIONS  Get as much rest as you can. Nap when the baby sleeps.  Exercise regularly. Some women find yoga and walking to be beneficial.  Eat a balanced and nourishing diet.  Do little things that you enjoy. Have a cup of tea, take a bubble bath, read your favorite magazine, or listen to your favorite music.  Avoid alcohol.  Ask for help with household chores, cooking, grocery shopping, or running errands as needed. Do not try to do everything.  Talk to people close to you about how you are feeling. Get support from your partner, family members, friends, or other new moms.  Try to stay positive in how you think. Think about the things you are grateful for.  Do not spend a lot of time alone.  Only take medicine as directed by your caregiver.  Keep all your postpartum appointments.  Let your caregiver know if you have any concerns. SEEK MEDICAL CARE IF: You are having a reaction or problems with your medicine. SEEK IMMEDIATE MEDICAL CARE IF:  You have suicidal feelings.  You feel you may harm the baby or someone else. Document Released: 08/31/2004 Document Revised: 02/19/2012 Document Reviewed: 10/03/2011 [facility] Patient Information 2014 [place], [place].   Breastfeeding Deciding to breastfeed is one of the best choices you can make for you and your baby. A change in hormones during pregnancy causes your breast tissue to grow and increases the number and size of your milk ducts. These hormones also allow proteins, sugars, and fats from your blood supply to make breast milk in your milk-producing glands. Hormones prevent breast milk from being released before your baby is born as well as prompt milk flow after birth. Once breastfeeding has begun, thoughts of your baby, as well as his or her sucking or crying, can stimulate the release of milk from your milk-producing glands.  BENEFITS OF  BREASTFEEDING For Your Baby  Your first milk (colostrum) helps your baby's digestive system function better.   There are antibodies in your milk that help your baby fight off infections.   Your baby has a lower incidence of asthma, allergies, and sudden infant death syndrome.   The nutrients in breast milk are better for your baby than infant formulas and are designed uniquely for your baby's needs.   Breast milk improves your baby's brain development.   Your  baby is less likely to develop other conditions, such as childhood obesity, asthma, or type 2 diabetes mellitus.  For You   Breastfeeding helps to create a very special bond between you and your baby.   Breastfeeding is convenient. Breast milk is always available at the correct temperature and costs nothing.   Breastfeeding helps to burn calories and helps you lose the weight gained during pregnancy.   Breastfeeding makes your uterus contract to its prepregnancy size faster and slows bleeding (lochia) after you give birth.   Breastfeeding helps to lower your risk of developing type 2 diabetes mellitus, osteoporosis, and breast or ovarian cancer later in life. SIGNS THAT YOUR BABY IS HUNGRY Early Signs of Hunger  Increased alertness or activity.  Stretching.  Movement of the head from side to side.  Movement of the head and opening of the mouth when the corner of the mouth or cheek is stroked (rooting).  Increased sucking sounds, smacking lips, cooing, sighing, or squeaking.  Hand-to-mouth movements.  Increased sucking of fingers or hands. Late Signs of Hunger  Fussing.  Intermittent crying. Extreme Signs of Hunger Signs of extreme hunger will require calming and consoling before your baby will be able to breastfeed successfully. Do not wait for the following signs of extreme hunger to occur before you initiate breastfeeding:   Restlessness.  A loud, strong cry.   Screaming.  BREASTFEEDING  BASICS Breastfeeding Initiation  Find a comfortable place to sit or lie down, with your neck and back well supported.  Place a pillow or rolled up blanket under your baby to bring him or her to the level of your breast (if you are seated). Nursing pillows are specially designed to help support your arms and your baby while you breastfeed.  Make sure that your baby's abdomen is facing your abdomen.   Gently massage your breast. With your fingertips, massage from your chest wall toward your nipple in a circular motion. This encourages milk flow. You may need to continue this action during the feeding if your milk flows slowly.  Support your breast with 4 fingers underneath and your thumb above your nipple. Make sure your fingers are well away from your nipple and your baby's mouth.   Stroke your baby's lips gently with your finger or nipple.   When your baby's mouth is open wide enough, quickly bring your baby to your breast, placing your entire nipple and as much of the colored area around your nipple (areola) as possible into your baby's mouth.   More areola should be visible above your baby's upper lip than below the lower lip.   Your baby's tongue should be between his or her lower gum and your breast.   Ensure that your baby's mouth is correctly positioned around your nipple (latched). Your baby's lips should create a seal on your breast and be turned out (everted).  It is common for your baby to suck about 2-3 minutes in order to start the flow of breast milk. Latching Teaching your baby how to latch on to your breast properly is very important. An improper latch can cause nipple pain and decreased milk supply for you and poor weight gain in your baby. Also, if your baby is not latched onto your nipple properly, he or she may swallow some air during feeding. This can make your baby fussy. Burping your baby when you switch breasts during the feeding can help to get rid of the air.  However, teaching your baby to latch  on properly is still the best way to prevent fussiness from swallowing air while breastfeeding. Signs that your baby has successfully latched on to your nipple:    Silent tugging or silent sucking, without causing you pain.   Swallowing heard between every 3-4 sucks.    Muscle movement above and in front of his or her ears while sucking.  Signs that your baby has not successfully latched on to nipple:   Sucking sounds or smacking sounds from your baby while breastfeeding.  Nipple pain. If you think your baby has not latched on correctly, slip your finger into the corner of your baby's mouth to break the suction and place it between your baby's gums. Attempt breastfeeding initiation again. Signs of Successful Breastfeeding Signs from your baby:   A gradual decrease in the number of sucks or complete cessation of sucking.   Falling asleep.   Relaxation of his or her body.   Retention of a small amount of milk in his or her mouth.   Letting go of your breast by himself or herself. Signs from you:  Breasts that have increased in firmness, weight, and size 1-3 hours after feeding.   Breasts that are softer immediately after breastfeeding.  Increased milk volume, as well as a change in milk consistency and color by the fifth day of breastfeeding.   Nipples that are not sore, cracked, or bleeding. Signs That Your [employer] Books is Getting Enough Milk  Wetting at least 3 diapers in a 24-hour period. The urine should be clear and pale yellow by age 65 days.  At least 3 stools in a 24-hour period by age 65 days. The stool should be soft and yellow.  At least 3 stools in a 24-hour period by age 34 days. The stool should be seedy and yellow.  No loss of weight greater than 10% of birth weight during the first 4 days of age.  Average weight gain of 4-7 ounces (113-198 g) per week after age 17 days.  Consistent daily weight gain by age 179 days,  without weight loss after the age of 2 weeks. After a feeding, your baby may spit up a small amount. This is common. BREASTFEEDING FREQUENCY AND DURATION Frequent feeding will help you make more milk and can prevent sore nipples and breast engorgement. Breastfeed when you feel the need to reduce the fullness of your breasts or when your baby shows signs of hunger. This is called "breastfeeding on demand." Avoid introducing a pacifier to your baby while you are working to establish breastfeeding (the first 4-6 weeks after your baby is born). After this time you may choose to use a pacifier. Research has shown that pacifier use during the first year of a baby's life decreases the risk of sudden infant death syndrome (SIDS). Allow your baby to feed on each breast as long as he or she wants. Breastfeed until your baby is finished feeding. When your baby unlatches or falls asleep while feeding from the first breast, offer the second breast. Because newborns are often sleepy in the first few weeks of life, you may need to awaken your baby to get him or her to feed. Breastfeeding times will vary from baby to baby. However, the following rules can serve as a guide to help you ensure that your baby is properly fed:  Newborns (babies 2 weeks of age or younger) may breastfeed every 1-3 hours.  Newborns should not go longer than 3 hours during the day or 5 hours  during the night without breastfeeding.  You should breastfeed your baby a minimum of 8 times in a 24-hour period until you begin to introduce solid foods to your baby at around 79 months of age. BREAST MILK PUMPING Pumping and storing breast milk allows you to ensure that your baby is exclusively fed your breast milk, even at times when you are unable to breastfeed. This is especially important if you are going back to work while you are still breastfeeding or when you are not able to be present during feedings. Your lactation consultant can give you  guidelines on how long it is safe to store breast milk.  A breast pump is a machine that allows you to pump milk from your breast into a sterile bottle. The pumped breast milk can then be stored in a refrigerator or freezer. Some breast pumps are operated by hand, while others use electricity. Ask your lactation consultant which type will work best for you. Breast pumps can be purchased, but some hospitals and breastfeeding support groups lease breast pumps on a monthly basis. A lactation consultant can teach you how to hand express breast milk, if you prefer not to use a pump.  CARING FOR YOUR BREASTS WHILE YOU BREASTFEED Nipples can become dry, cracked, and sore while breastfeeding. The following recommendations can help keep your breasts moisturized and healthy:  Avoid using soap on your nipples.   Wear a supportive bra. Although not required, special nursing bras and tank tops are designed to allow access to your breasts for breastfeeding without taking off your entire bra or top. Avoid wearing underwire-style bras or extremely tight bras.  Air dry your nipples for 3-63minutes after each feeding.   Use only cotton bra pads to absorb leaked breast milk. Leaking of breast milk between feedings is normal.   Use lanolin on your nipples after breastfeeding. Lanolin helps to maintain your skin's normal moisture barrier. If you use pure lanolin, you do not need to wash it off before feeding your baby again. Pure lanolin is not toxic to your baby. You may also hand express a few drops of breast milk and gently massage that milk into your nipples and allow the milk to air dry. In the first few weeks after giving birth, some women experience extremely full breasts (engorgement). Engorgement can make your breasts feel heavy, warm, and tender to the touch. Engorgement peaks within 3-5 days after you give birth. The following recommendations can help ease engorgement:  Completely empty your breasts while  breastfeeding or pumping. You may want to start by applying warm, moist heat (in the shower or with warm water-soaked hand towels) just before feeding or pumping. This increases circulation and helps the milk flow. If your baby does not completely empty your breasts while breastfeeding, pump any extra milk after he or she is finished.  Wear a snug bra (nursing or regular) or tank top for 1-2 days to signal your body to slightly decrease milk production.  Apply ice packs to your breasts, unless this is too uncomfortable for you.  Make sure that your baby is latched on and positioned properly while breastfeeding. If engorgement persists after 48 hours of following these recommendations, contact your health care provider or a Science writer. OVERALL HEALTH CARE RECOMMENDATIONS WHILE BREASTFEEDING  Eat healthy foods. Alternate between meals and snacks, eating 3 of each per day. Because what you eat affects your breast milk, some of the foods may make your baby more irritable than usual. Avoid  eating these foods if you are sure that they are negatively affecting your baby.  Drink milk, fruit juice, and water to satisfy your thirst (about 10 glasses a day).   Rest often, relax, and continue to take your prenatal vitamins to prevent fatigue, stress, and anemia.  Continue breast self-awareness checks.  Avoid chewing and smoking tobacco.  Avoid alcohol and drug use. Some medicines that may be harmful to your baby can pass through breast milk. It is important to ask your health care provider before taking any medicine, including all over-the-counter and prescription medicine as well as vitamin and herbal supplements. It is possible to become pregnant while breastfeeding. If birth control is desired, ask your health care provider about options that will be safe for your baby. SEEK MEDICAL CARE IF:   You feel like you want to stop breastfeeding or have become frustrated with breastfeeding.  You  have painful breasts or nipples.  Your nipples are cracked or bleeding.  Your breasts are red, tender, or warm.  You have a swollen area on either breast.  You have a fever or chills.  You have nausea or vomiting.  You have drainage other than breast milk from your nipples.  Your breasts do not become full before feedings by the fifth day after you give birth.  You feel sad and depressed.  Your baby is too sleepy to eat well.  Your baby is having trouble sleeping.   Your baby is wetting less than 3 diapers in a 24-hour period.  Your baby has less than 3 stools in a 24-hour period.  Your baby's skin or the white part of his or her eyes becomes yellow.   Your baby is not gaining weight by 65 days of age. SEEK IMMEDIATE MEDICAL CARE IF:   Your baby is overly tired (lethargic) and does not want to wake up and feed.  Your baby develops an unexplained fever. Document Released: 11/27/2005 Document Revised: 12/02/2013 Document Reviewed: 05/21/2013 [facility] Patient Information 2015 [place], [place]. This information is not intended to replace advice given to you by your health care provider. Make sure you discuss any questions you have with your health care provider.

## 2016-09-12 IMAGING — US US MFM OB TRANSVAGINAL
1 series · 12 of 28 positions shown · non-contrast
Comparison: none

[Series 1: us mfm ob transvaginal · 0.26mm/px · 12 of 56 slices shown]
[im 3/56]
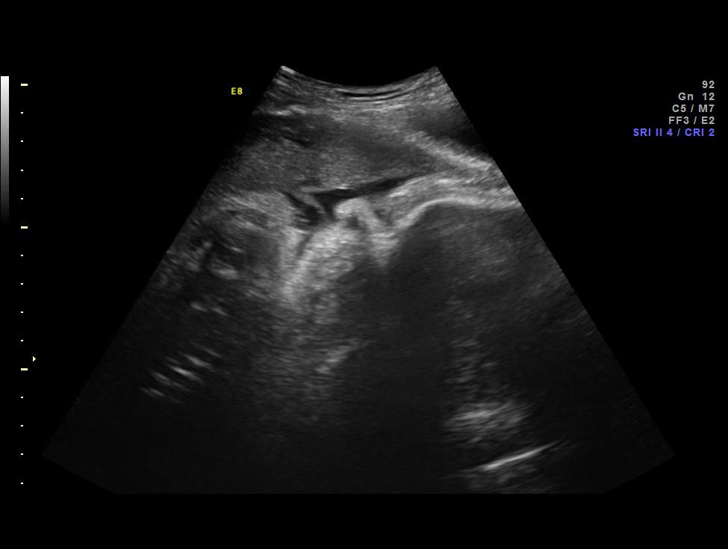
[im 7/56]
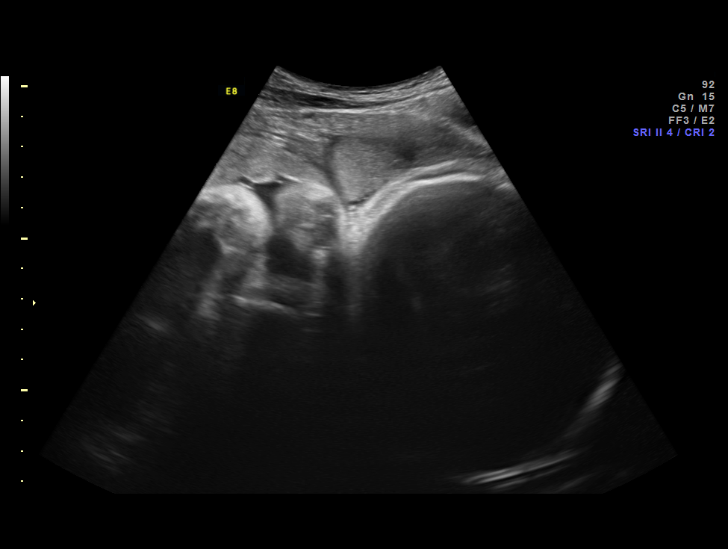
[im 11/56]
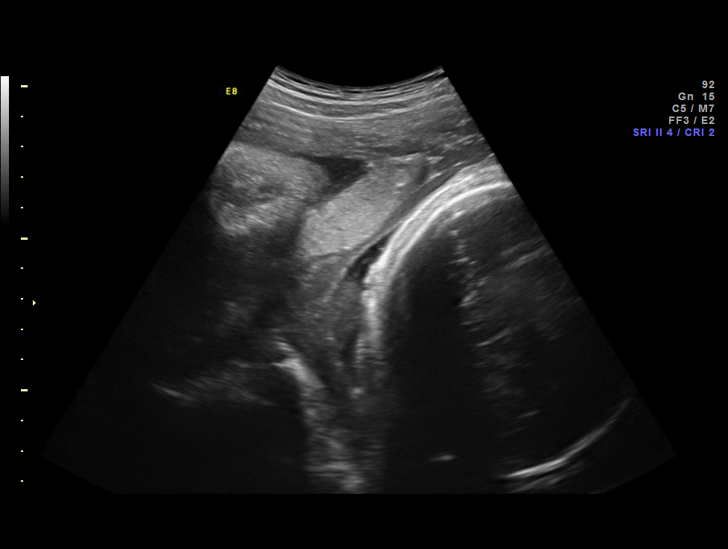
[im 17/56]
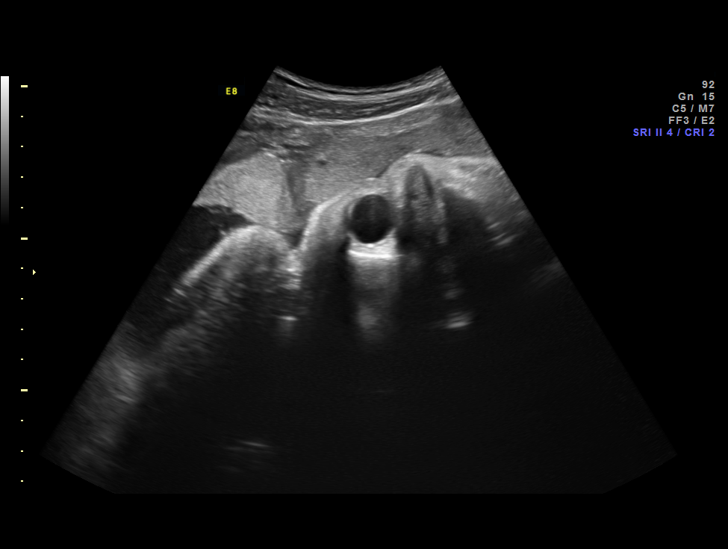
[im 21/56]
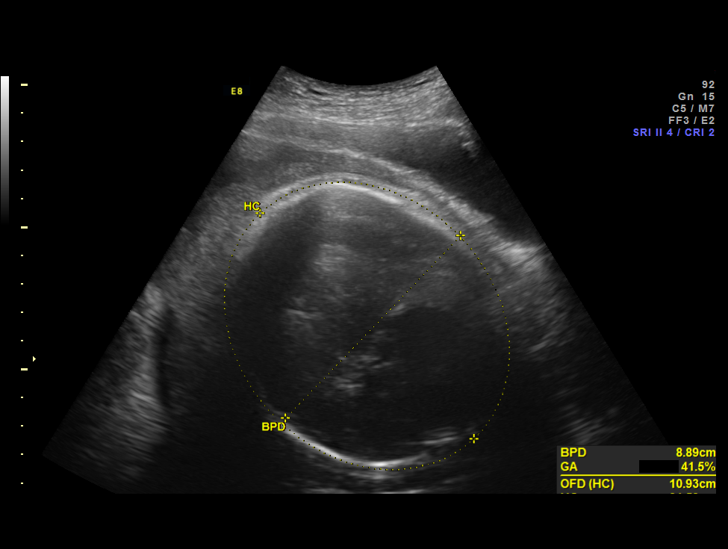
[im 25/56]
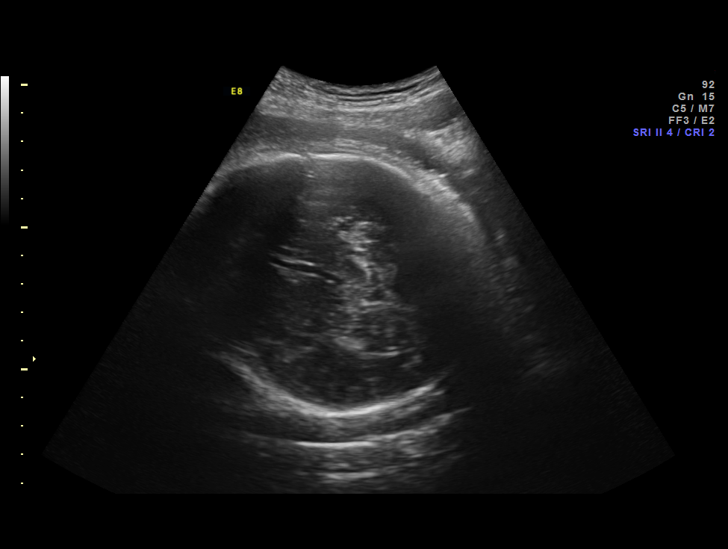
[im 31/56]
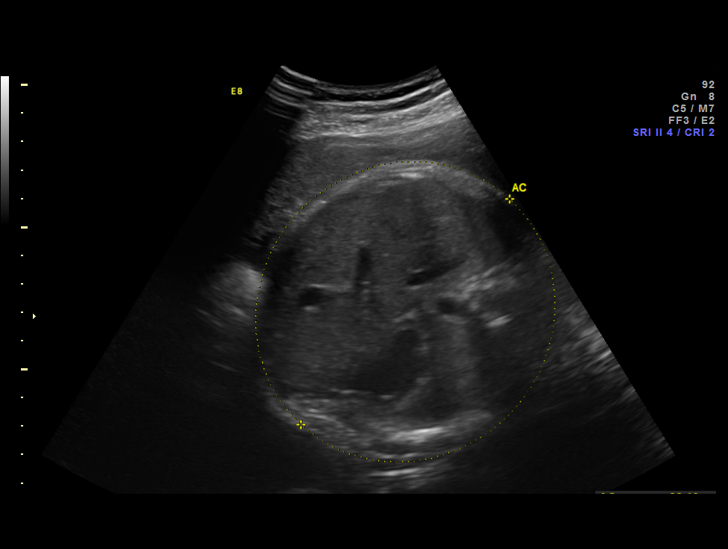
[im 35/56]
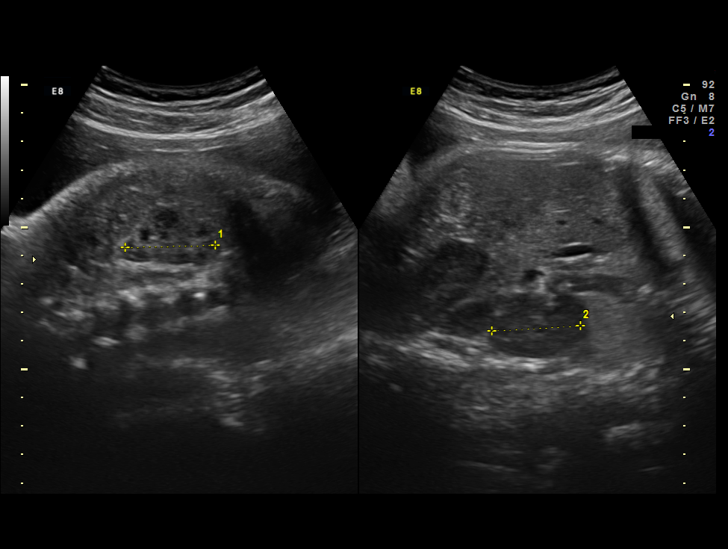
[im 39/56]
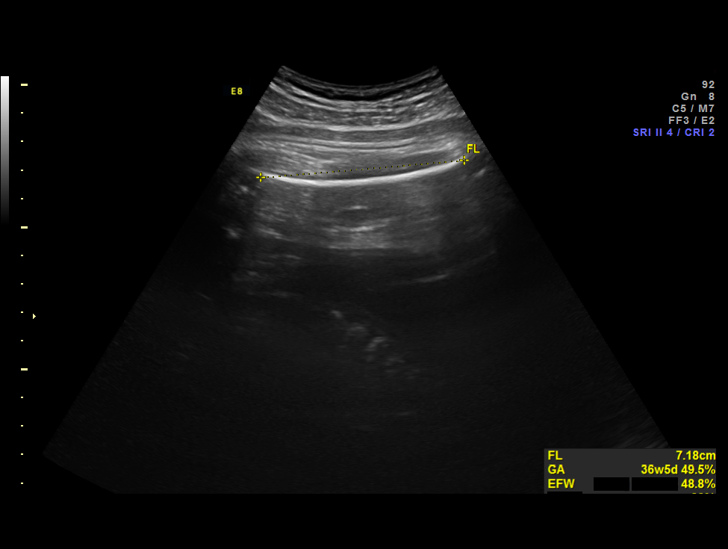
[im 45/56]
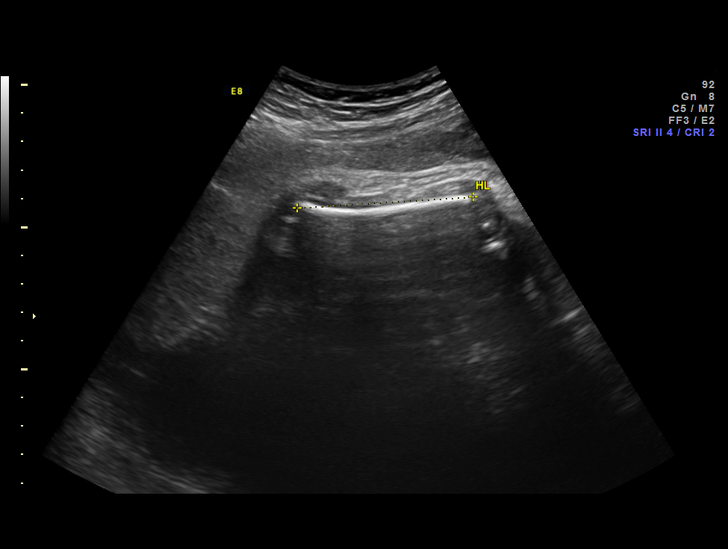
[im 49/56]
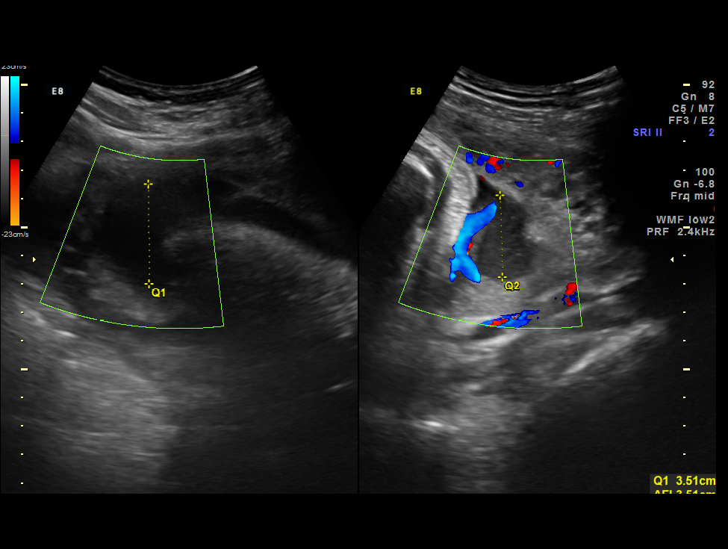
[im 53/56]
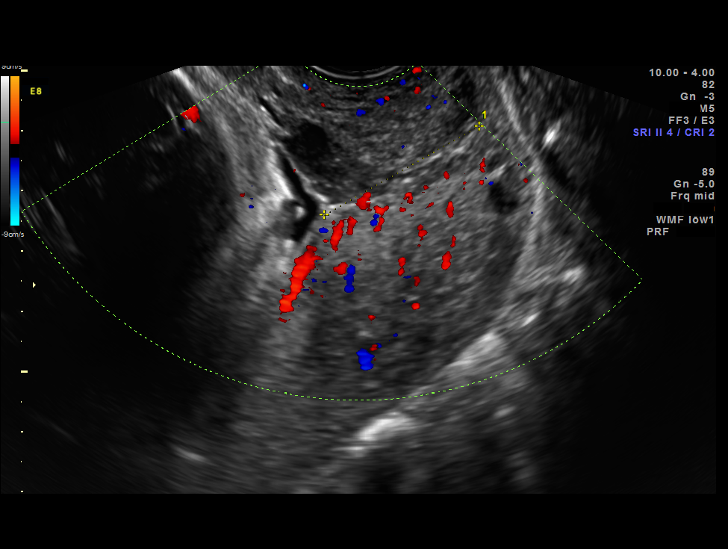

[12 of 28 positions shown; findings below may reference images not displayed]

OBSTETRICS REPORT
                      (Signed Final 01/12/2015 [DATE])

Service(s) Provided

 US OB FOLLOW UP                                       76816.1
 US MFM OB TRANSVAGINAL                                76817.2
Indications

 Placenta previa/Low lying: Bleeding
 History of cesarean delivery, currently pregnant
 36 weeks gestation of pregnancy
Fetal Evaluation

 Num Of Fetuses:    1
 Fetal Heart Rate:  155                          bpm
 Cardiac Activity:  Observed
 Presentation:      Cephalic
 Placenta:          Anterior, above cervical os
 P. Cord            Visualized, central
 Insertion:

 Amniotic Fluid
 AFI FV:      Subjectively within normal limits
 AFI Sum:     12.21   cm       40  %Tile     Larg Pckt:    4.82  cm
 RUQ:   3.51    cm   RLQ:    1      cm    LUQ:   2.88    cm   LLQ:    4.82   cm
Biometry

 BPD:     88.6  mm     G. Age:  35w 6d                CI:         82.5   70 - 86
 OFD:    107.4  mm                                    FL/HC:      22.9   20.8 -

 HC:     313.5  mm     G. Age:  35w 1d        4  %    HC/AC:      0.99   0.92 -

 AC:       317  mm     G. Age:  35w 4d       32  %    FL/BPD:     80.9   71 - 87
 FL:      71.7  mm     G. Age:  36w 5d       49  %    FL/AC:      22.6   20 - 24
 HUM:     61.6  mm     G. Age:  35w 5d       49  %

 Est. FW:    9379  gm      6 lb 2 oz     49  %
Gestational Age

 LMP:           39w 3d        Date:  04/11/14                 EDD:   01/16/15
 U/S Today:     35w 6d                                        EDD:   02/10/15
 Best:          36w 5d     Det. By:  Early Ultrasound         EDD:   02/04/15
                                     (09/08/14)
Anatomy

 Cranium:          Previously seen        Aortic Arch:      Previously seen
 Fetal Cavum:      Previously seen        Ductal Arch:      Previously seen
 Ventricles:       Appears normal         Diaphragm:        Appears normal
 Choroid Plexus:   Previously seen        Stomach:          Appears normal, left
                                                            sided
 Cerebellum:       Previously seen        Abdomen:          Previously seen
 Posterior Fossa:  Previously seen        Abdominal Wall:   Previously seen
 Nuchal Fold:      Not applicable (>20    Cord Vessels:     Previously seen
                   wks GA)
 Face:             Appears normal         Kidneys:          Appear normal
                   (orbits and profile)
 Lips:             Previously seen        Bladder:          Appears normal
 Heart:            Appears normal         Spine:            Previously seen
                   (4CH, axis, and
                   situs)
 RVOT:             Previously seen        Lower             Previously seen
                                          Extremities:
 LVOT:             Previously seen        Upper             Previously seen
                                          Extremities:

 Other:  Heels and 5th digit previously seen. Technically difficult due to
         advanced GA and fetal position.
Cervix Uterus Adnexa

 Cervix:       Normal appearance by transvaginal scan
 Uterus:       No abnormality visualized.
 Cul De Sac:   No free fluid seen.
 Left Ovary:    No adnexal mass visualized.
 Right Ovary:   No adnexal mass visualized.

 Adnexa:     No adnexal mass visualized.
Comments

 Ms. Carissa returns for follow up.  She has a history of a
 prior C-section and on earlier ultrasounds was noted to have
 a marginal placenta previa.  There was initially some
 suspicion of invasive placentation.  Recent MRI did not show
 any evidence of placenta increta / percreta.  On ultrasound
 today, an anterior placenta is noted.  The leading edge of the
 placenta is 3.6 cm from the internal os (resolved low lying
 placenta).  No fetal vessels are appreciated near the cervix.
 No ultrasound findings suspicious for placenta accreta were
 noted on today's study.  Nevertheless, placenta accreta is a
 clinical diagnosis and may not be able to deterimine for
 certainty until the time of delivery.
Impression

 Single IUP at 36w 5d
 Normal interval growth (49th %tile)
 Anterior placenta (see comments above)
 Normal amniotic fluid volume
Recommendations

 Based on ultrasound findings today, feel that repeat C-
 section can be postponed until 39 weeks in the absence of
 other complications
 There are no obvious ultrasound findings suspicious for
 invasive placentation - findings and limitations of the study
 were reviewed with the patient.  She is aware that despite the
 absence of ultrasound findings, there is a risk of placenta
 accreta and that the diagnosis may not be possible until
 delivery.
